# Patient Record
Sex: Female | Born: 1960 | Race: White | Hispanic: No | Marital: Married
Health system: Southern US, Community
[De-identification: ages and names within clinical notes are randomized; demographics above are authoritative.]

## PROBLEM LIST (undated history)

## (undated) DIAGNOSIS — J95811 Postprocedural pneumothorax: Secondary | ICD-10-CM

## (undated) DIAGNOSIS — J9621 Acute and chronic respiratory failure with hypoxia: Secondary | ICD-10-CM

## (undated) DIAGNOSIS — Z9689 Presence of other specified functional implants: Secondary | ICD-10-CM

## (undated) DIAGNOSIS — J181 Lobar pneumonia, unspecified organism: Secondary | ICD-10-CM

## (undated) DIAGNOSIS — J869 Pyothorax without fistula: Secondary | ICD-10-CM

## (undated) HISTORY — PX: LUNG DECORTICATION: SHX454

## (undated) HISTORY — PX: CHEST TUBE INSERTION: SHX231

## (undated) HISTORY — PX: INTUBATION-ENDOTRACHEAL WITH TRACHEOSTOMY STANDBY: SHX6592

---

## 2018-06-22 ENCOUNTER — Inpatient Hospital Stay
Admit: 2018-06-22 | Discharge: 2018-07-19 | Disposition: A | Discharge status: Home / Self Care | Payer: Medicare Other | Source: Other Acute Inpatient Hospital | Attending: Internal Medicine | Admitting: Internal Medicine

## 2018-06-22 DIAGNOSIS — J9621 Acute and chronic respiratory failure with hypoxia: Secondary | ICD-10-CM | POA: Diagnosis present

## 2018-06-22 DIAGNOSIS — J90 Pleural effusion, not elsewhere classified: Secondary | ICD-10-CM

## 2018-06-22 DIAGNOSIS — J189 Pneumonia, unspecified organism: Secondary | ICD-10-CM

## 2018-06-22 DIAGNOSIS — J869 Pyothorax without fistula: Secondary | ICD-10-CM | POA: Diagnosis present

## 2018-06-22 DIAGNOSIS — J181 Lobar pneumonia, unspecified organism: Secondary | ICD-10-CM

## 2018-06-22 DIAGNOSIS — J95811 Postprocedural pneumothorax: Secondary | ICD-10-CM | POA: Diagnosis present

## 2018-06-22 DIAGNOSIS — Z9689 Presence of other specified functional implants: Secondary | ICD-10-CM

## 2018-06-22 DIAGNOSIS — Z9889 Other specified postprocedural states: Secondary | ICD-10-CM

## 2018-06-22 DIAGNOSIS — J939 Pneumothorax, unspecified: Secondary | ICD-10-CM

## 2018-06-22 HISTORY — DX: Pyothorax without fistula: J86.9

## 2018-06-22 HISTORY — DX: Lobar pneumonia, unspecified organism: J18.1

## 2018-06-22 HISTORY — DX: Postprocedural pneumothorax: J95.811

## 2018-06-22 HISTORY — DX: Acute and chronic respiratory failure with hypoxia: J96.21

## 2018-06-22 HISTORY — DX: Presence of other specified functional implants: Z96.89

## 2018-06-23 ENCOUNTER — Institutional Professional Consult (permissible substitution) (HOSPITAL_COMMUNITY): Payer: Medicare Other

## 2018-06-23 ENCOUNTER — Encounter: Payer: Self-pay | Admitting: Internal Medicine

## 2018-06-23 DIAGNOSIS — J15211 Pneumonia due to Methicillin susceptible Staphylococcus aureus: Secondary | ICD-10-CM

## 2018-06-23 DIAGNOSIS — Z9689 Presence of other specified functional implants: Secondary | ICD-10-CM

## 2018-06-23 DIAGNOSIS — J869 Pyothorax without fistula: Secondary | ICD-10-CM | POA: Diagnosis not present

## 2018-06-23 DIAGNOSIS — J9621 Acute and chronic respiratory failure with hypoxia: Secondary | ICD-10-CM | POA: Diagnosis not present

## 2018-06-23 DIAGNOSIS — J95811 Postprocedural pneumothorax: Secondary | ICD-10-CM | POA: Diagnosis not present

## 2018-06-23 DIAGNOSIS — J181 Lobar pneumonia, unspecified organism: Secondary | ICD-10-CM

## 2018-06-23 DIAGNOSIS — J189 Pneumonia, unspecified organism: Secondary | ICD-10-CM

## 2018-06-23 HISTORY — DX: Pneumonia, unspecified organism: J18.9

## 2018-06-23 LAB — COMPREHENSIVE METABOLIC PANEL
ALT: 6 U/L (ref 0–44)
AST: 16 U/L (ref 15–41)
Albumin: 1.9 g/dL — ABNORMAL LOW (ref 3.5–5.0)
Alkaline Phosphatase: 80 U/L (ref 38–126)
Anion gap: 15 (ref 5–15)
BUN: <5 mg/dL — ABNORMAL LOW (ref 6–20)
CHLORIDE: 103 mmol/L (ref 98–111)
CO2: 24 mmol/L (ref 22–32)
Calcium: 8.2 mg/dL — ABNORMAL LOW (ref 8.9–10.3)
Creatinine, Ser: 0.74 mg/dL (ref 0.44–1.00)
GFR calc Af Amer: >60 mL/min (ref >60)
Glucose, Bld: 144 mg/dL — ABNORMAL HIGH (ref 70–99)
Potassium: 3 mmol/L — ABNORMAL LOW (ref 3.5–5.1)
Sodium: 142 mmol/L (ref 135–145)
Total Bilirubin: 0.5 mg/dL (ref 0.3–1.2)
Total Protein: 6 g/dL — ABNORMAL LOW (ref 6.5–8.1)

## 2018-06-23 LAB — CBC WITH DIFFERENTIAL/PLATELET
Basophils Absolute: 0.1 10*3/uL (ref 0.0–0.1)
Basophils Relative: 1 %
Eosinophils Absolute: 0.3 10*3/uL (ref 0.0–0.7)
Eosinophils Relative: 2 %
HEMATOCRIT: 27.2 % — AB (ref 36.0–46.0)
Hemoglobin: 8.1 g/dL — ABNORMAL LOW (ref 12.0–15.0)
LYMPHS ABS: 3 10*3/uL (ref 0.7–4.0)
Lymphocytes Relative: 23 %
MCH: 28.5 pg (ref 26.0–34.0)
MCHC: 29.8 g/dL — AB (ref 30.0–36.0)
MCV: 95.8 fL (ref 78.0–100.0)
Monocytes Absolute: 0.9 10*3/uL (ref 0.1–1.0)
Monocytes Relative: 7 %
NEUTROS ABS: 8.7 10*3/uL — AB (ref 1.7–7.7)
Neutrophils Relative %: 67 %
PLATELETS: 534 10*3/uL — AB (ref 150–400)
RBC: 2.84 MIL/uL — ABNORMAL LOW (ref 3.87–5.11)
RDW: 16 % — AB (ref 11.5–15.5)
WBC: 13 10*3/uL — ABNORMAL HIGH (ref 4.0–10.5)

## 2018-06-23 LAB — PROTIME-INR
INR: 1.38
Prothrombin Time: 16.8 seconds — ABNORMAL HIGH (ref 11.4–15.2)

## 2018-06-23 LAB — POTASSIUM: POTASSIUM: 3.2 mmol/L — AB (ref 3.5–5.1)

## 2018-06-23 MED ORDER — INSULIN LISPRO 100 UNIT/ML ~~LOC~~ SOLN
2.00 | SUBCUTANEOUS | Status: DC
Start: 2018-06-22 — End: 2018-06-23

## 2018-06-23 MED ORDER — GENERIC EXTERNAL MEDICATION
650.00 | Status: DC
Start: ? — End: 2018-06-23

## 2018-06-23 MED ORDER — FAMOTIDINE 20 MG PO TABS
20.00 | ORAL_TABLET | ORAL | Status: DC
Start: 2018-06-22 — End: 2018-06-23

## 2018-06-23 MED ORDER — LABETALOL HCL 5 MG/ML IV SOLN
10.00 | INTRAVENOUS | Status: DC
Start: ? — End: 2018-06-23

## 2018-06-23 MED ORDER — BENZOCAINE-MENTHOL 15-3.6 MG MT LOZG
1.00 | LOZENGE | OROMUCOSAL | Status: DC
Start: ? — End: 2018-06-23

## 2018-06-23 MED ORDER — ALPRAZOLAM 0.5 MG PO TABS
0.50 | ORAL_TABLET | ORAL | Status: DC
Start: ? — End: 2018-06-23

## 2018-06-23 MED ORDER — INSULIN GLARGINE 100 UNIT/ML ~~LOC~~ SOLN
10.00 | SUBCUTANEOUS | Status: DC
Start: 2018-06-22 — End: 2018-06-23

## 2018-06-23 MED ORDER — GENERIC EXTERNAL MEDICATION
4.00 | Status: DC
Start: ? — End: 2018-06-23

## 2018-06-23 MED ORDER — LORAZEPAM 1 MG PO TABS
1.00 | ORAL_TABLET | ORAL | Status: DC
Start: 2018-06-22 — End: 2018-06-23

## 2018-06-23 MED ORDER — SODIUM CHLORIDE 0.9 % IJ SOLN
10.00 | INTRAMUSCULAR | Status: DC
Start: 2018-06-22 — End: 2018-06-23

## 2018-06-23 MED ORDER — SODIUM CHLORIDE 0.9 % IV SOLN
10.00 | INTRAVENOUS | Status: DC
Start: ? — End: 2018-06-23

## 2018-06-23 MED ORDER — MORPHINE SULFATE (PF) 2 MG/ML IV SOLN
2.00 | INTRAVENOUS | Status: DC
Start: ? — End: 2018-06-23

## 2018-06-23 MED ORDER — HYDROCODONE-ACETAMINOPHEN 5-325 MG PO TABS
1.00 | ORAL_TABLET | ORAL | Status: DC
Start: ? — End: 2018-06-23

## 2018-06-23 MED ORDER — ACETAMINOPHEN 325 MG PO TABS
650.00 | ORAL_TABLET | ORAL | Status: DC
Start: ? — End: 2018-06-23

## 2018-06-23 MED ORDER — AMLODIPINE BESYLATE 5 MG PO TABS
5.00 | ORAL_TABLET | ORAL | Status: DC
Start: 2018-06-23 — End: 2018-06-23

## 2018-06-23 MED ORDER — THERA PO TABS
1.00 | ORAL_TABLET | ORAL | Status: DC
Start: 2018-06-23 — End: 2018-06-23

## 2018-06-23 MED ORDER — FLUOXETINE HCL 20 MG PO CAPS
40.00 | ORAL_CAPSULE | ORAL | Status: DC
Start: 2018-06-23 — End: 2018-06-23

## 2018-06-23 MED ORDER — OXYCODONE HCL 10 MG PO TABS
10.00 | ORAL_TABLET | ORAL | Status: DC
Start: ? — End: 2018-06-23

## 2018-06-23 MED ORDER — ONDANSETRON HCL 4 MG/2ML IJ SOLN
4.00 | INTRAMUSCULAR | Status: DC
Start: ? — End: 2018-06-23

## 2018-06-23 MED ORDER — POTASSIUM CHLORIDE CRYS ER 20 MEQ PO TBCR
40.00 | EXTENDED_RELEASE_TABLET | ORAL | Status: DC
Start: 2018-06-23 — End: 2018-06-23

## 2018-06-23 MED ORDER — METOCLOPRAMIDE HCL 5 MG/ML IJ SOLN
10.00 | INTRAMUSCULAR | Status: DC
Start: ? — End: 2018-06-23

## 2018-06-23 MED ORDER — IPRATROPIUM-ALBUTEROL 0.5-2.5 (3) MG/3ML IN SOLN
3.00 | RESPIRATORY_TRACT | Status: DC
Start: ? — End: 2018-06-23

## 2018-06-23 MED ORDER — ENOXAPARIN SODIUM 40 MG/0.4ML ~~LOC~~ SOLN
40.00 | SUBCUTANEOUS | Status: DC
Start: 2018-06-22 — End: 2018-06-23

## 2018-06-23 MED ORDER — OXYCODONE HCL 5 MG PO TABS
5.00 | ORAL_TABLET | ORAL | Status: DC
Start: ? — End: 2018-06-23

## 2018-06-23 MED ORDER — CLINDAMYCIN HCL 150 MG PO CAPS
450.00 | ORAL_CAPSULE | ORAL | Status: DC
Start: 2018-06-22 — End: 2018-06-23

## 2018-06-23 MED ORDER — MUPIROCIN 2 % EX OINT
.25 | TOPICAL_OINTMENT | CUTANEOUS | Status: DC
Start: 2018-06-22 — End: 2018-06-23

## 2018-06-23 MED ORDER — HYDROMORPHONE HCL 1 MG/ML IJ SOLN
0.50 | INTRAMUSCULAR | Status: DC
Start: ? — End: 2018-06-23

## 2018-06-23 MED ORDER — ALUM & MAG HYDROXIDE-SIMETH 200-200-20 MG/5ML PO SUSP
30.00 | ORAL | Status: DC
Start: ? — End: 2018-06-23

## 2018-06-23 MED ORDER — CEFAZOLIN SODIUM-DEXTROSE 2-3 GM-%(50ML) IV SOLR
2.00 | INTRAVENOUS | Status: DC
Start: 2018-06-22 — End: 2018-06-23

## 2018-06-23 NOTE — Consult Note (Addendum)
Pulmonary Critical Care Medicine [facility] [place]  PULMONARY SERVICE  Date of Service: 06/23/2018  PULMONARY CRITICAL CARE CONSULT   Kristina Bush  HYQ:[id number]  DOB: 1961/04/30   DOA: 06/22/2018  Referring Physician: Carron Curie, MD  HPI: Kristina Bush is a 57 y.o. female seen for follow up of Acute on Chronic Respiratory Failure.  Patient was apparently admitted to the transferring facility with a diagnosis of empyema of the right side.  Patient required a decortication which was done on 6 September.  Patient had also developed a right lower lobe pneumonia and pneumothorax on the right side.  Patient was found to have sepsis with staphylococcal bacteremia.  She was treated with IV antibiotics and had negative blood cultures as of 27 August.  Hospital course was as follows she was admitted with septic shock acute renal failure lactic acidosis and a pneumothorax.  Patient was started on resuscitation with fluid also placed on pressors.  Patient was given broad-spectrum antibiotics.  She also was intubated at the time and had a right-sided CVL placed which resulted in a pneumothorax.  Uptake apparently was 5 and she did grow staph 2 out of 2.  She was transferred to [facility] and at the time of transfer she was intubated on Levophed and she was eventually weaned and extubated.  Her course was complicated by development of an empyema which required surgical decortication.  She now presents to our facility with the chest tube in place and she still has significant leak there is concern obviously for bronchopleural fistula however according to the discharge summary she is supposed to eventually be placed on waterseal and then hopefully have the tube removed.  Review of Systems:  ROS performed and is unremarkable other than noted above.  Past Medical History:  Diagnosis Date  . Acute on chronic respiratory failure with hypoxia (HCC)   . Chest tube in place   . Empyema lung (HCC)    . Iatrogenic pneumothorax     Past Surgical History:  Procedure Laterality Date  . CHEST TUBE INSERTION    . INTUBATION-ENDOTRACHEAL WITH TRACHEOSTOMY STANDBY    . LUNG DECORTICATION      Social History:    has an unknown smoking status. She has never used smokeless tobacco. She reports that she drank alcohol. She reports that she has current or past drug history.  Family History: Non-Contributory to the present illness  Allergies not on file  Medications: Reviewed on Rounds  Physical Exam:  Vitals: Temperature 97.6 pulse 98 respiratory rate 20 blood pressure 148/90 saturations 100%  Ventilator Settings patient is off of the ventilator  . General: Comfortable at this time . Eyes: Grossly normal lids, irises & conjunctiva . ENT: grossly tongue is normal . Neck: no obvious mass . Cardiovascular: S1-S2 normal no gallop or rub . Respiratory: Coarse breath sounds no rhonchi no rales are noted. . Abdomen: Soft nontender . Skin: no rash seen on limited exam . Musculoskeletal: not rigid . Psychiatric:unable to assess . Neurologic: no seizure no involuntary movements         Labs on Admission:  Basic Metabolic Panel: Recent Labs  Lab 06/23/18 0541  NA 142  K 3.0*  CL 103  CO2 24  GLUCOSE 144*  BUN <5*  CREATININE 0.74  CALCIUM 8.2*    No results for input(s): PHART, PCO2ART, PO2ART, HCO3, O2SAT in the last 168 hours.  Liver Function Tests: Recent Labs  Lab 06/23/18 0541  AST 16  ALT 6  ALKPHOS 80  BILITOT 0.5  PROT 6.0*  ALBUMIN 1.9*   No results for input(s): LIPASE, AMYLASE in the last 168 hours. No results for input(s): AMMONIA in the last 168 hours.  CBC: Recent Labs  Lab 06/23/18 0541  WBC 13.0*  NEUTROABS 8.7*  HGB 8.1*  HCT 27.2*  MCV 95.8  PLT 534*    Cardiac Enzymes: No results for input(s): CKTOTAL, CKMB, CKMBINDEX, TROPONINI in the last 168 hours.  BNP (last 3 results) No results for input(s): BNP in the last 8760  hours.  ProBNP (last 3 results) No results for input(s): PROBNP in the last 8760 hours.   Radiological Exams on Admission: Dg Chest Port 1 View  Result Date: 06/23/2018 CLINICAL DATA:  Chest tube placement EXAM: PORTABLE CHEST 1 VIEW COMPARISON:  None. FINDINGS: There are 2 chest tubes on the right without appreciable pneumothorax. There is subcutaneous air on the right. There is a minimal right pleural effusion. There is no edema or consolidation. Heart size and pulmonary vascularity are normal. No adenopathy. IMPRESSION: Chest tubes present on the right. There is subcutaneous air on the right, but there is no evident pneumothorax. No edema or consolidation. Heart size normal. Electronically Signed   By: Bretta Bang III M.D.   On: 06/23/2018 07:36    Assessment/Plan Active Problems:   Acute on chronic respiratory failure with hypoxia (HCC)   Empyema lung (HCC)   Iatrogenic pneumothorax   Chest tube in place   1. Acute on chronic respiratory failure with hypoxia patient status post intubation and extubation this is pretty much resolved she remains on supportive care currently she is actually not requiring any oxygen. 2. Empyema she had decortication as she had a complicated empyema at the other facility.  We will continue to monitor with x-rays. 3. Iatrogenic pneumothorax chest tube in place 4. Chest tube in place we are going to work hopefully towards removal of the chest tube right now she does still have a leak will eventually we will place her on waterseal and if her leak stops then we can probably safely discontinue the chest tube. 5. Right lower lobe pneumonia she has been treated with antibiotics we will need to continue to monitor her closely right now she is clinically improving the last chest x-ray shows no pneumothorax no consolidation we will continue to monitor  I have personally seen and evaluated the patient, evaluated laboratory and imaging results, formulated the  assessment and plan and placed orders. The Patient requires high complexity decision making for assessment and support.  Case was discussed on Rounds with the Respiratory Therapy Staff Time Spent  Yevonne Pax, MD [facility] Pulmonary Critical Care Medicine Sleep Medicine

## 2018-06-24 DIAGNOSIS — J181 Lobar pneumonia, unspecified organism: Secondary | ICD-10-CM

## 2018-06-24 DIAGNOSIS — J9621 Acute and chronic respiratory failure with hypoxia: Secondary | ICD-10-CM | POA: Diagnosis not present

## 2018-06-24 DIAGNOSIS — J90 Pleural effusion, not elsewhere classified: Secondary | ICD-10-CM

## 2018-06-24 DIAGNOSIS — Z9689 Presence of other specified functional implants: Secondary | ICD-10-CM | POA: Diagnosis not present

## 2018-06-24 DIAGNOSIS — J95811 Postprocedural pneumothorax: Secondary | ICD-10-CM | POA: Diagnosis not present

## 2018-06-24 DIAGNOSIS — J869 Pyothorax without fistula: Secondary | ICD-10-CM | POA: Diagnosis not present

## 2018-06-24 LAB — C DIFFICILE QUICK SCREEN W PCR REFLEX
C DIFFICILE (CDIFF) INTERP: NOT DETECTED
C DIFFICILE (CDIFF) TOXIN: NEGATIVE
C Diff antigen: NEGATIVE

## 2018-06-24 LAB — POTASSIUM: Potassium: 3 mmol/L — ABNORMAL LOW (ref 3.5–5.1)

## 2018-06-24 NOTE — Progress Notes (Signed)
Pulmonary Critical Care Medicine [facility] [place]   PULMONARY CRITICAL CARE SERVICE  PROGRESS NOTE  Date of Service: 06/24/2018  Kristina Bush  ZOX:[id number]  DOB: 06-Apr-1961   DOA: 06/22/2018  Referring Physician: Carron Curie, MD  HPI: Kristina Bush is a 57 y.o. female seen for follow up of Acute on Chronic Respiratory Failure.  Patient is comfortable without distress at this time.  Has been off the ventilator.  Chest tube still has a leak in it.  She is not able to be put to waterseal at this time  Medications: Reviewed on Rounds  Physical Exam:  Vitals: Temperature 98.0 pulse 110 respiratory rate 16 blood pressure 147/88 saturations 98%  Ventilator Settings off the ventilator at this time on room air  . General: Comfortable at this time . Eyes: Grossly normal lids, irises & conjunctiva . ENT: grossly tongue is normal . Neck: no obvious mass . Cardiovascular: S1 S2 normal no gallop . Respiratory: No rhonchi no rales . Abdomen: soft . Skin: no rash seen on limited exam . Musculoskeletal: not rigid . Psychiatric:unable to assess . Neurologic: no seizure no involuntary movements         Lab Data:   Basic Metabolic Panel: Recent Labs  Lab 06/23/18 0541 06/23/18 2148 06/24/18 0728  NA 142  --   --   K 3.0* 3.2* 3.0*  CL 103  --   --   CO2 24  --   --   GLUCOSE 144*  --   --   BUN <5*  --   --   CREATININE 0.74  --   --   CALCIUM 8.2*  --   --     ABG: No results for input(s): PHART, PCO2ART, PO2ART, HCO3, O2SAT in the last 168 hours.  Liver Function Tests: Recent Labs  Lab 06/23/18 0541  AST 16  ALT 6  ALKPHOS 80  BILITOT 0.5  PROT 6.0*  ALBUMIN 1.9*   No results for input(s): LIPASE, AMYLASE in the last 168 hours. No results for input(s): AMMONIA in the last 168 hours.  CBC: Recent Labs  Lab 06/23/18 0541  WBC 13.0*  NEUTROABS 8.7*  HGB 8.1*  HCT 27.2*  MCV 95.8  PLT 534*    Cardiac Enzymes: No results for  input(s): CKTOTAL, CKMB, CKMBINDEX, TROPONINI in the last 168 hours.  BNP (last 3 results) No results for input(s): BNP in the last 8760 hours.  ProBNP (last 3 results) No results for input(s): PROBNP in the last 8760 hours.  Radiological Exams: Dg Chest Port 1 View  Result Date: 06/23/2018 CLINICAL DATA:  Chest tube placement EXAM: PORTABLE CHEST 1 VIEW COMPARISON:  None. FINDINGS: There are 2 chest tubes on the right without appreciable pneumothorax. There is subcutaneous air on the right. There is a minimal right pleural effusion. There is no edema or consolidation. Heart size and pulmonary vascularity are normal. No adenopathy. IMPRESSION: Chest tubes present on the right. There is subcutaneous air on the right, but there is no evident pneumothorax. No edema or consolidation. Heart size normal. Electronically Signed   By: Bretta Bang III M.D.   On: 06/23/2018 07:36    Assessment/Plan Active Problems:   Acute on chronic respiratory failure with hypoxia (HCC)   Empyema lung (HCC)   Iatrogenic pneumothorax   Chest tube in place   Right lower lobe pneumonia (HCC)   1. Acute on chronic respiratory failure with hypoxia we will continue with the oxygen if needed.  Patient right now is off of the ventilator.  And off of the oxygen. 2. Empyema of the lung status post chest tube we will continue with supportive care 3. Iatrogenic pneumothorax still with a leak continue present management 4. Chest tube we will continue to suction 5. Right lower lobe pneumonia at baseline now since treated last chest x-ray revealed no pneumothorax and no consolidation   I have personally seen and evaluated the patient, evaluated laboratory and imaging results, formulated the assessment and plan and placed orders. The Patient requires high complexity decision making for assessment and support.  Case was discussed on Rounds with the Respiratory Therapy Staff  Yevonne PaxSaadat A Khan, MD [facility] Pulmonary Critical  Care Medicine Sleep Medicine

## 2018-06-25 ENCOUNTER — Other Ambulatory Visit (HOSPITAL_COMMUNITY): Payer: Medicare Other

## 2018-06-25 LAB — POTASSIUM: POTASSIUM: 4 mmol/L (ref 3.5–5.1)

## 2018-06-27 ENCOUNTER — Other Ambulatory Visit (HOSPITAL_COMMUNITY): Payer: Medicare Other

## 2018-06-27 LAB — BASIC METABOLIC PANEL
Anion gap: 11 (ref 5–15)
BUN: 7 mg/dL (ref 6–20)
CHLORIDE: 100 mmol/L (ref 98–111)
CO2: 26 mmol/L (ref 22–32)
CREATININE: 0.63 mg/dL (ref 0.44–1.00)
Calcium: 8.4 mg/dL — ABNORMAL LOW (ref 8.9–10.3)
GFR calc Af Amer: >60 mL/min (ref >60)
GFR calc non Af Amer: >60 mL/min (ref >60)
Glucose, Bld: 151 mg/dL — ABNORMAL HIGH (ref 70–99)
Potassium: 2.8 mmol/L — ABNORMAL LOW (ref 3.5–5.1)
Sodium: 137 mmol/L (ref 135–145)

## 2018-06-27 LAB — CBC
HEMATOCRIT: 29.7 % — AB (ref 36.0–46.0)
HEMOGLOBIN: 9 g/dL — AB (ref 12.0–15.0)
MCH: 28.6 pg (ref 26.0–34.0)
MCHC: 30.3 g/dL (ref 30.0–36.0)
MCV: 94.3 fL (ref 78.0–100.0)
Platelets: 564 10*3/uL — ABNORMAL HIGH (ref 150–400)
RBC: 3.15 MIL/uL — ABNORMAL LOW (ref 3.87–5.11)
RDW: 16.1 % — AB (ref 11.5–15.5)
WBC: 15.8 10*3/uL — ABNORMAL HIGH (ref 4.0–10.5)

## 2018-06-27 LAB — MAGNESIUM: Magnesium: 1.8 mg/dL (ref 1.7–2.4)

## 2018-06-28 ENCOUNTER — Other Ambulatory Visit (HOSPITAL_COMMUNITY): Payer: Medicare Other

## 2018-06-28 DIAGNOSIS — J869 Pyothorax without fistula: Secondary | ICD-10-CM | POA: Diagnosis not present

## 2018-06-28 DIAGNOSIS — J9621 Acute and chronic respiratory failure with hypoxia: Secondary | ICD-10-CM | POA: Diagnosis not present

## 2018-06-28 DIAGNOSIS — Z9689 Presence of other specified functional implants: Secondary | ICD-10-CM | POA: Diagnosis not present

## 2018-06-28 DIAGNOSIS — J95811 Postprocedural pneumothorax: Secondary | ICD-10-CM | POA: Diagnosis not present

## 2018-06-28 LAB — BASIC METABOLIC PANEL
ANION GAP: 12 (ref 5–15)
BUN: 8 mg/dL (ref 6–20)
CO2: 25 mmol/L (ref 22–32)
Calcium: 8.6 mg/dL — ABNORMAL LOW (ref 8.9–10.3)
Chloride: 100 mmol/L (ref 98–111)
Creatinine, Ser: 0.66 mg/dL (ref 0.44–1.00)
Glucose, Bld: 168 mg/dL — ABNORMAL HIGH (ref 70–99)
POTASSIUM: 4 mmol/L (ref 3.5–5.1)
Sodium: 137 mmol/L (ref 135–145)

## 2018-06-28 LAB — MAGNESIUM: MAGNESIUM: 1.9 mg/dL (ref 1.7–2.4)

## 2018-06-28 NOTE — Progress Notes (Signed)
Pulmonary Critical Care Medicine [facility] [place]   PULMONARY CRITICAL CARE SERVICE  PROGRESS NOTE  Date of Service: 06/28/2018  Kristina Bush  ZOX:[id number]  DOB: 01/31/1961   DOA: 06/22/2018  Referring Physician: Carron Curie, MD  HPI: Kristina Bush is a 57 y.o. female seen for follow up of Acute on Chronic Respiratory Failure.  Patient remains off oxygen.  Still has significant break in the chest tube  Medications: Reviewed on Rounds  Physical Exam:  Vitals: Temperature 97.7 pulse 92 respiratory 20 blood pressure 132/75 saturation 97%  Ventilator Settings off the ventilator at this time  . General: Comfortable at this time . Eyes: Grossly normal lids, irises & conjunctiva . ENT: grossly tongue is normal . Neck: no obvious mass . Cardiovascular: S1 S2 normal no gallop . Respiratory: No rhonchi or rales are noted . Abdomen: soft . Skin: no rash seen on limited exam . Musculoskeletal: not rigid . Psychiatric:unable to assess . Neurologic: no seizure no involuntary movements         Lab Data:   Basic Metabolic Panel: Recent Labs  Lab 06/23/18 0541 06/23/18 2148 06/24/18 0728 06/25/18 0555 06/27/18 0654 06/28/18 1000  NA 142  --   --   --  137 137  K 3.0* 3.2* 3.0* 4.0 2.8* 4.0  CL 103  --   --   --  100 100  CO2 24  --   --   --  26 25  GLUCOSE 144*  --   --   --  151* 168*  BUN <5*  --   --   --  7 8  CREATININE 0.74  --   --   --  0.63 0.66  CALCIUM 8.2*  --   --   --  8.4* 8.6*  MG  --   --   --   --  1.8 1.9    ABG: No results for input(s): PHART, PCO2ART, PO2ART, HCO3, O2SAT in the last 168 hours.  Liver Function Tests: Recent Labs  Lab 06/23/18 0541  AST 16  ALT 6  ALKPHOS 80  BILITOT 0.5  PROT 6.0*  ALBUMIN 1.9*   No results for input(s): LIPASE, AMYLASE in the last 168 hours. No results for input(s): AMMONIA in the last 168 hours.  CBC: Recent Labs  Lab 06/23/18 0541 06/27/18 0654  WBC 13.0* 15.8*   NEUTROABS 8.7*  --   HGB 8.1* 9.0*  HCT 27.2* 29.7*  MCV 95.8 94.3  PLT 534* 564*    Cardiac Enzymes: No results for input(s): CKTOTAL, CKMB, CKMBINDEX, TROPONINI in the last 168 hours.  BNP (last 3 results) No results for input(s): BNP in the last 8760 hours.  ProBNP (last 3 results) No results for input(s): PROBNP in the last 8760 hours.  Radiological Exams: Dg Chest Port 1 View  Result Date: 06/28/2018 CLINICAL DATA:  57 year old female with pleural effusion and chest tubes. Subsequent encounter. EXAM: PORTABLE CHEST 1 VIEW COMPARISON:  06/27/2018 chest x-ray. FINDINGS: Two right-sided chest tubes are in place. 1 of the chest tubes side hole is at the level of the border of the thorax and cannot be confirmed as completely within the right thorax. Subcutaneous emphysema. Pleural thickening. No obvious pneumothorax. Right base subsegmental atelectasis and elevated right hemidiaphragm. Cardiomegaly.  Calcified tortuous aorta.  Possible hiatal hernia. IMPRESSION: 1. Two right-sided chest tubes are in place. 1 of the chest tubes side hole is at the level of the border of the thorax and  cannot be confirmed as completely within the right thorax (slight patient rotation to the right limits evaluation). Subcutaneous emphysema. 2. Pleural thickening.  No obvious pneumothorax. 3. Right base subsegmental atelectasis and elevated right hemidiaphragm. 4.  Aortic Atherosclerosis (ICD10-I70.0). 5. These results will be called to the ordering clinician or representative by the Radiologist Assistant, and communication documented in the PACS or zVision Dashboard. Electronically Signed   By: Lacy DuverneySteven  Olson M.D.   On: 06/28/2018 07:28   Dg Chest Port 1 View  Result Date: 06/27/2018 CLINICAL DATA:  Pleural effusion EXAM: PORTABLE CHEST 1 VIEW COMPARISON:  06/25/2018 FINDINGS: Postsurgical changes in the right hemithorax with stable loculated right pleural effusion/lateral pleural thickening. Indwelling right  chest tubes.  No pneumothorax is seen. Left lung is clear. The heart is normal in size IMPRESSION: Stable loculated right pleural effusion/lateral pleural thickening. Indwelling right chest tubes.  No pneumothorax is seen. Electronically Signed   By: Charline BillsSriyesh  Krishnan M.D.   On: 06/27/2018 08:05    Assessment/Plan Active Problems:   Acute on chronic respiratory failure with hypoxia (HCC)   Empyema lung (HCC)   Iatrogenic pneumothorax   Chest tube in place   Right lower lobe pneumonia (HCC)   1. Acute on chronic respiratory failure with hypoxia we will continue with the supportive care no oxygen required right now. 2. Iatrogenic pneumothorax still with significant leak 3. Empyema drained we will continue to monitor 4. Chest tube need to work towards hopefully placing on waterseal right now still has a leak 5. Right lower lobe pneumonia treated last chest x-ray had shown a stable right pleural effusion which was loculated   I have personally seen and evaluated the patient, evaluated laboratory and imaging results, formulated the assessment and plan and placed orders. The Patient requires high complexity decision making for assessment and support.  Case was discussed on Rounds with the Respiratory Therapy Staff  Yevonne PaxSaadat A Khan, MD [facility] Pulmonary Critical Care Medicine Sleep Medicine

## 2018-06-29 LAB — CULTURE, BLOOD (ROUTINE X 2)
CULTURE: NO GROWTH
Culture: NO GROWTH
Special Requests: ADEQUATE

## 2018-06-30 ENCOUNTER — Other Ambulatory Visit (HOSPITAL_COMMUNITY): Payer: Medicare Other

## 2018-06-30 DIAGNOSIS — J95811 Postprocedural pneumothorax: Secondary | ICD-10-CM | POA: Diagnosis not present

## 2018-06-30 DIAGNOSIS — Z9689 Presence of other specified functional implants: Secondary | ICD-10-CM | POA: Diagnosis not present

## 2018-06-30 DIAGNOSIS — J9621 Acute and chronic respiratory failure with hypoxia: Secondary | ICD-10-CM | POA: Diagnosis not present

## 2018-06-30 DIAGNOSIS — J869 Pyothorax without fistula: Secondary | ICD-10-CM | POA: Diagnosis not present

## 2018-06-30 LAB — CBC
HEMATOCRIT: 29.5 % — AB (ref 36.0–46.0)
Hemoglobin: 8.7 g/dL — ABNORMAL LOW (ref 12.0–15.0)
MCH: 27.8 pg (ref 26.0–34.0)
MCHC: 29.5 g/dL — ABNORMAL LOW (ref 30.0–36.0)
MCV: 94.2 fL (ref 78.0–100.0)
PLATELETS: 560 10*3/uL — AB (ref 150–400)
RBC: 3.13 MIL/uL — AB (ref 3.87–5.11)
RDW: 16 % — ABNORMAL HIGH (ref 11.5–15.5)
WBC: 12.6 10*3/uL — AB (ref 4.0–10.5)

## 2018-06-30 LAB — BASIC METABOLIC PANEL
ANION GAP: 14 (ref 5–15)
BUN: 8 mg/dL (ref 6–20)
CALCIUM: 8.6 mg/dL — AB (ref 8.9–10.3)
CO2: 24 mmol/L (ref 22–32)
CREATININE: 0.69 mg/dL (ref 0.44–1.00)
Chloride: 102 mmol/L (ref 98–111)
GFR calc non Af Amer: >60 mL/min (ref >60)
GLUCOSE: 218 mg/dL — AB (ref 70–99)
POTASSIUM: 3.2 mmol/L — AB (ref 3.5–5.1)
Sodium: 140 mmol/L (ref 135–145)

## 2018-06-30 NOTE — Progress Notes (Signed)
[facility]ulmonary [facility] [place]   PULMONARY CRITICAL CARE SERVICE  PROGRESS NOTE  Date of Service: 06/30/2018  Kristina FitchJudy Ann Bush  ZOX:[id number]  DOB: 09-Feb-1961   DOA: 06/22/2018  Referring Physician: Carron CurieAli Hijazi, MD  HPI: Kristina Bush is a 57 y.o. female seen for follow up of Acute on Chronic Respiratory Failure.  She is still having a leak in her water chamber.  Patient is on room air.  I suspect that this is going to possibly require surgical input.  Medications: Reviewed on Rounds  Physical Exam:  Vitals: Temperature 98.2 pulse 90 respiratory rate 20 blood pressure is 1 4468 saturations 100%  Ventilator Settings off the ventilator on room air  . General: Comfortable at this time . Eyes: Grossly normal lids, irises & conjunctiva . ENT: grossly tongue is normal . Neck: no obvious mass . Cardiovascular: S1 S2 normal no gallop . Respiratory: Coarse breath sounds no rhonchi . Abdomen: soft . Skin: no rash seen on limited exam . Musculoskeletal: not rigid . Psychiatric:unable to assess . Neurologic: no seizure no involuntary movements         Lab Data:   Basic Metabolic Panel: Recent Labs  Lab 06/24/18 0728 06/25/18 0555 06/27/18 0654 06/28/18 1000 06/30/18 0637  NA  --   --  137 137 140  K 3.0* 4.0 2.8* 4.0 3.2*  CL  --   --  100 100 102  CO2  --   --  26 25 24   GLUCOSE  --   --  151* 168* 218*  BUN  --   --  7 8 8   CREATININE  --   --  0.63 0.66 0.69  CALCIUM  --   --  8.4* 8.6* 8.6*  MG  --   --  1.8 1.9  --     ABG: No results for input(s): PHART, PCO2ART, PO2ART, HCO3, O2SAT in the last 168 hours.  Liver Function Tests: No results for input(s): AST, ALT, ALKPHOS, BILITOT, PROT, ALBUMIN in the last 168 hours. No results for input(s): LIPASE, AMYLASE in the last 168 hours. No results for input(s): AMMONIA in the last 168 hours.  CBC: Recent Labs  Lab 06/27/18 0654 06/30/18 0637  WBC 15.8* 12.6*  HGB 9.0*  8.7*  HCT 29.7* 29.5*  MCV 94.3 94.2  PLT 564* 560*    Cardiac Enzymes: No results for input(s): CKTOTAL, CKMB, CKMBINDEX, TROPONINI in the last 168 hours.  BNP (last 3 results) No results for input(s): BNP in the last 8760 hours.  ProBNP (last 3 results) No results for input(s): PROBNP in the last 8760 hours.  Radiological Exams: Dg Chest Port 1 View  Result Date: 06/30/2018 CLINICAL DATA:  Chest tube placement. EXAM: PORTABLE CHEST 1 VIEW COMPARISON:  Radiograph of June 28, 2018. FINDINGS: Stable cardiomediastinal silhouette. Left lung is clear. Stable position of 2 right-sided chest tubes. No definite pneumothorax is noted. Mild subcutaneous emphysema is seen over right lateral chest wall which is unchanged compared to prior exam. Probable mild loculated pleural effusion or pleural thickening is seen in right lung base laterally. Mild right basilar atelectasis is noted. Bony thorax is unremarkable. IMPRESSION: Stable position of 2 right-sided chest tubes. No definite pneumothorax. Mild right basilar subsegmental atelectasis is noted. Stable probable mild loculated right pleural effusion or pleural thickening. Electronically Signed   By: Lupita RaiderJames  Green Jr, M.D.   On: 06/30/2018 10:43    Assessment/Plan Active Problems:   Acute on chronic respiratory failure  with hypoxia (HCC)   Empyema lung (HCC)   Iatrogenic pneumothorax   Chest tube in place   Right lower lobe pneumonia (HCC)   1. Acute on chronic respiratory failure with hypoxia we will continue with supportive care not on any oxygen right now 2. Empyema of the lung treated status post chest tube placement 3. Iatrogenic pneumothorax no pneumothorax noted on the last chest film however she still has significant leak 4. Right lower lobe pneumonia treated we will continue with supportive care 5. Chest tube in place continue with present management as noted if the leak does not resolve we might need to have surgery see the  patient and give their opinion   I have personally seen and evaluated the patient, evaluated laboratory and imaging results, formulated the assessment and plan and placed orders. The Patient requires high complexity decision making for assessment and support.  Case was discussed on Rounds with the Respiratory Therapy Staff  Yevonne Pax, MD [facility] Pulmonary Critical Care Medicine Sleep Medicine

## 2018-07-01 LAB — BASIC METABOLIC PANEL
ANION GAP: 11 (ref 5–15)
BUN: 7 mg/dL (ref 6–20)
CO2: 23 mmol/L (ref 22–32)
Calcium: 8.3 mg/dL — ABNORMAL LOW (ref 8.9–10.3)
Chloride: 107 mmol/L (ref 98–111)
Creatinine, Ser: 0.57 mg/dL (ref 0.44–1.00)
Glucose, Bld: 127 mg/dL — ABNORMAL HIGH (ref 70–99)
POTASSIUM: 3.6 mmol/L (ref 3.5–5.1)
Sodium: 141 mmol/L (ref 135–145)

## 2018-07-05 ENCOUNTER — Other Ambulatory Visit (HOSPITAL_COMMUNITY): Payer: Medicare Other

## 2018-07-06 DIAGNOSIS — J95811 Postprocedural pneumothorax: Secondary | ICD-10-CM | POA: Diagnosis not present

## 2018-07-06 DIAGNOSIS — J869 Pyothorax without fistula: Secondary | ICD-10-CM | POA: Diagnosis not present

## 2018-07-06 DIAGNOSIS — Z9689 Presence of other specified functional implants: Secondary | ICD-10-CM | POA: Diagnosis not present

## 2018-07-06 DIAGNOSIS — J9621 Acute and chronic respiratory failure with hypoxia: Secondary | ICD-10-CM | POA: Diagnosis not present

## 2018-07-06 LAB — CBC
HCT: 31.1 % — ABNORMAL LOW (ref 36.0–46.0)
Hemoglobin: 9.4 g/dL — ABNORMAL LOW (ref 12.0–15.0)
MCH: 29.2 pg (ref 26.0–34.0)
MCHC: 30.2 g/dL (ref 30.0–36.0)
MCV: 96.6 fL (ref 78.0–100.0)
PLATELETS: 596 10*3/uL — AB (ref 150–400)
RBC: 3.22 MIL/uL — ABNORMAL LOW (ref 3.87–5.11)
RDW: 16.6 % — AB (ref 11.5–15.5)
WBC: 10.6 10*3/uL — ABNORMAL HIGH (ref 4.0–10.5)

## 2018-07-06 LAB — BASIC METABOLIC PANEL
Anion gap: 11 (ref 5–15)
BUN: 11 mg/dL (ref 6–20)
CO2: 25 mmol/L (ref 22–32)
Calcium: 9.1 mg/dL (ref 8.9–10.3)
Chloride: 106 mmol/L (ref 98–111)
Creatinine, Ser: 0.68 mg/dL (ref 0.44–1.00)
GFR calc Af Amer: >60 mL/min (ref >60)
GLUCOSE: 179 mg/dL — AB (ref 70–99)
POTASSIUM: 3.8 mmol/L (ref 3.5–5.1)
Sodium: 142 mmol/L (ref 135–145)

## 2018-07-06 LAB — MAGNESIUM: Magnesium: 1.7 mg/dL (ref 1.7–2.4)

## 2018-07-06 NOTE — Progress Notes (Signed)
[facility] [place]   PULMONARY CRITICAL CARE SERVICE  PROGRESS NOTE  Date of Service: 07/06/2018  Kristina FitchJudy Ann Bush  ZOX:[id number]  DOB: 03-01-1961   DOA: 06/22/2018  Referring Physician: Carron CurieAli Hijazi, MD  HPI: Kristina Bush is a 57 y.o. female seen for follow up of Acute on Chronic Respiratory Failure.  She is grossly unchanged doing fairly well no distress noted at this time.  Chest x-ray was done which shows a trace pneumothorax along with some fluid.  Patient's chest tube unfortunately still showing leak  Medications: Reviewed on Rounds  Physical Exam:  Vitals: Temperature 98.7 pulse 90 respiratory rate 18 blood pressure 120/60 saturations 99%  Ventilator Settings off the ventilator on room air  . General: Comfortable at this time . Eyes: Grossly normal lids, irises & conjunctiva . ENT: grossly tongue is normal . Neck: no obvious mass . Cardiovascular: S1 S2 normal no gallop . Respiratory: No rhonchi or rales are noted at this time. . Abdomen: soft . Skin: no rash seen on limited exam . Musculoskeletal: not rigid . Psychiatric:unable to assess . Neurologic: no seizure no involuntary movements         Lab Data:   Basic Metabolic Panel: Recent Labs  Lab 06/30/18 0637 07/01/18 0606 07/06/18 0654  NA 140 141 142  K 3.2* 3.6 3.8  CL 102 107 106  CO2 24 23 25   GLUCOSE 218* 127* 179*  BUN 8 7 11   CREATININE 0.69 0.57 0.68  CALCIUM 8.6* 8.3* 9.1  MG  --   --  1.7    ABG: No results for input(s): PHART, PCO2ART, PO2ART, HCO3, O2SAT in the last 168 hours.  Liver Function Tests: No results for input(s): AST, ALT, ALKPHOS, BILITOT, PROT, ALBUMIN in the last 168 hours. No results for input(s): LIPASE, AMYLASE in the last 168 hours. No results for input(s): AMMONIA in the last 168 hours.  CBC: Recent Labs  Lab 06/30/18 0637 07/06/18 0654  WBC 12.6* 10.6*  HGB 8.7* 9.4*  HCT 29.5* 31.1*  MCV 94.2 96.6  PLT  560* 596*    Cardiac Enzymes: No results for input(s): CKTOTAL, CKMB, CKMBINDEX, TROPONINI in the last 168 hours.  BNP (last 3 results) No results for input(s): BNP in the last 8760 hours.  ProBNP (last 3 results) No results for input(s): PROBNP in the last 8760 hours.  Radiological Exams: Dg Chest Port 1 View  Result Date: 07/05/2018 CLINICAL DATA:  Right chest tubes EXAM: PORTABLE CHEST 1 VIEW COMPARISON:  06/30/2018 FINDINGS: Two chest tubes remain on the right, stable in position. Residual right basilar atelectasis/consolidation remains medially. Small loculated right pleural effusion noted laterally. Persistent curvilinear lucency along the right hemidiaphragm suspicious for a small residual right basilar pneumothorax. No enlarging pneumothorax by plain radiography. Right lung remains clear. Normal heart size and vascularity. Trachea is midline. IMPRESSION: Stable right chest tube. Suspect trace residual basilar pneumothorax along the hemidiaphragm Persistent right lower lobe atelectasis/partial consolidation and small loculated lateral right pleural effusion Electronically Signed   By: Judie PetitM.  Shick M.D.   On: 07/05/2018 08:08    Assessment/Plan Active Problems:   Acute on chronic respiratory failure with hypoxia (HCC)   Empyema lung (HCC)   Iatrogenic pneumothorax   Chest tube in place   Right lower lobe pneumonia (HCC)   1. Acute on chronic respiratory failure with hypoxia we will continue with supportive care patient currently not requiring any oxygen. 2. Empyema of the lung status post drainage  and thoracoscopy.  Still with persistent leak present 3. Pneumothorax she still has a significant leak present the pneumothorax itself is minimal at this time. 4. Chest tube I think eventually went on and up having to have surgery take a look and see if any further invasive procedures are necessary to close the leak 5. Right lower lobe pneumonia treated with some atelectasis and partial  consolidation is present.  Would consider CT scan for further evaluation it would also help [place] to evaluate the pneumothorax.   I have personally seen and evaluated the patient, evaluated laboratory and imaging results, formulated the assessment and plan and placed orders. The Patient requires high complexity decision making for assessment and support.  Case was discussed on Rounds with the Respiratory Therapy Staff  Yevonne Pax, MD [facility] Pulmonary Critical Care Medicine Sleep Medicine

## 2018-07-07 LAB — MAGNESIUM: MAGNESIUM: 1.9 mg/dL (ref 1.7–2.4)

## 2018-07-08 ENCOUNTER — Other Ambulatory Visit (HOSPITAL_COMMUNITY): Payer: Medicare Other

## 2018-07-08 DIAGNOSIS — J869 Pyothorax without fistula: Secondary | ICD-10-CM | POA: Diagnosis not present

## 2018-07-08 DIAGNOSIS — J9621 Acute and chronic respiratory failure with hypoxia: Secondary | ICD-10-CM | POA: Diagnosis not present

## 2018-07-08 DIAGNOSIS — J95811 Postprocedural pneumothorax: Secondary | ICD-10-CM | POA: Diagnosis not present

## 2018-07-08 DIAGNOSIS — Z9689 Presence of other specified functional implants: Secondary | ICD-10-CM | POA: Diagnosis not present

## 2018-07-08 LAB — CBC
HCT: 34 % — ABNORMAL LOW (ref 36.0–46.0)
Hemoglobin: 10.3 g/dL — ABNORMAL LOW (ref 12.0–15.0)
MCH: 29.1 pg (ref 26.0–34.0)
MCHC: 30.3 g/dL (ref 30.0–36.0)
MCV: 96 fL (ref 78.0–100.0)
PLATELETS: 564 10*3/uL — AB (ref 150–400)
RBC: 3.54 MIL/uL — ABNORMAL LOW (ref 3.87–5.11)
RDW: 16.3 % — AB (ref 11.5–15.5)
WBC: 11.4 10*3/uL — ABNORMAL HIGH (ref 4.0–10.5)

## 2018-07-08 LAB — BASIC METABOLIC PANEL
Anion gap: 12 (ref 5–15)
BUN: 8 mg/dL (ref 6–20)
CO2: 25 mmol/L (ref 22–32)
CREATININE: 0.54 mg/dL (ref 0.44–1.00)
Calcium: 9.4 mg/dL (ref 8.9–10.3)
Chloride: 105 mmol/L (ref 98–111)
GFR calc Af Amer: >60 mL/min (ref >60)
GLUCOSE: 164 mg/dL — AB (ref 70–99)
POTASSIUM: 4 mmol/L (ref 3.5–5.1)
Sodium: 142 mmol/L (ref 135–145)

## 2018-07-08 LAB — MAGNESIUM: Magnesium: 1.7 mg/dL (ref 1.7–2.4)

## 2018-07-08 NOTE — Progress Notes (Signed)
Pulmonary Critical Care Medicine [facility] [place]   PULMONARY CRITICAL CARE SERVICE  PROGRESS NOTE  Date of Service: 07/08/2018  Kristina Bush  ONG:[id number]  DOB: [birth year]   DOA: 06/22/2018  Referring Physician: Carron Curie, MD  HPI: Kristina Bush is a 57 y.o. female seen for follow up of Acute on Chronic Respiratory Failure.  She is on room air comfortable no distress.  Continues to have a leak in her waterseal.  Medications: Reviewed on Rounds  Physical Exam:  Vitals: 97.4 pulse 90 respiratory rate 18 blood pressure 120/60 saturation 99%  Ventilator Settings off the ventilator  . General: Comfortable at this time . Eyes: Grossly normal lids, irises & conjunctiva . ENT: grossly tongue is normal . Neck: no obvious mass . Cardiovascular: S1 S2 normal no gallop . Respiratory: No rhonchi no rales . Abdomen: soft . Skin: no rash seen on limited exam . Musculoskeletal: not rigid . Psychiatric:unable to assess . Neurologic: no seizure no involuntary movements         Lab Data:   Basic Metabolic Panel: Recent Labs  Lab 07/06/18 0654 07/07/18 0535  NA 142  --   K 3.8  --   CL 106  --   CO2 25  --   GLUCOSE 179*  --   BUN 11  --   CREATININE 0.68  --   CALCIUM 9.1  --   MG 1.7 1.9    ABG: No results for input(s): PHART, PCO2ART, PO2ART, HCO3, O2SAT in the last 168 hours.  Liver Function Tests: No results for input(s): AST, ALT, ALKPHOS, BILITOT, PROT, ALBUMIN in the last 168 hours. No results for input(s): LIPASE, AMYLASE in the last 168 hours. No results for input(s): AMMONIA in the last 168 hours.  CBC: Recent Labs  Lab 07/06/18 0654 07/08/18 1440  WBC 10.6* 11.4*  HGB 9.4* 10.3*  HCT 31.1* 34.0*  MCV 96.6 96.0  PLT 596* 564*    Cardiac Enzymes: No results for input(s): CKTOTAL, CKMB, CKMBINDEX, TROPONINI in the last 168 hours.  BNP (last 3 results) No results for input(s): BNP in the last 8760 hours.  ProBNP (last 3  results) No results for input(s): PROBNP in the last 8760 hours.  Radiological Exams: Dg Chest Port 1 View  Result Date: 07/08/2018 CLINICAL DATA:  Chest tube and empyema EXAM: PORTABLE CHEST 1 VIEW COMPARISON:  07/05/2018 FINDINGS: Heart is normal in size. Lungs remain under aerated. There are 2 right chest tubes. Small residual right basilar pneumothorax. Patchy opacities at the right lung base are stable. Loculated pleural effusion along the right chest sidewall is stable. Left lung remains clear. IMPRESSION: Stable appearance of the right hemithorax with chest tubes and a tiny basilar pneumothorax. Stable patchy opacities at the right base and small loculated right pleural effusion. Electronically Signed   By: Jolaine Click M.D.   On: 07/08/2018 09:36    Assessment/Plan Active Problems:   Acute on chronic respiratory failure with hypoxia (HCC)   Empyema lung (HCC)   Iatrogenic pneumothorax   Chest tube in place   Right lower lobe pneumonia (HCC)   1. Acute on chronic respiratory failure clinically resolved suspect stable at this time. 2. Pneumothorax remains still with a small pneumothorax and a leak needs to have thoracic surgery evaluate 3. Chest tube remains in place 4. Right lower lobe pneumonia has been treated monitor x-rays  I discussed the case with the primary care team they are going to try to make arrangement  for her to be transferred for having thoracic surgery to evaluate  I have personally seen and evaluated the patient, evaluated laboratory and imaging results, formulated the assessment and plan and placed orders. The Patient requires high complexity decision making for assessment and support.  Case was discussed on Rounds with the Respiratory Therapy Staff  Yevonne Pax, MD [facility] Pulmonary Critical Care Medicine Sleep Medicine

## 2018-07-09 DIAGNOSIS — J9621 Acute and chronic respiratory failure with hypoxia: Secondary | ICD-10-CM | POA: Diagnosis not present

## 2018-07-09 DIAGNOSIS — J95811 Postprocedural pneumothorax: Secondary | ICD-10-CM | POA: Diagnosis not present

## 2018-07-09 DIAGNOSIS — J869 Pyothorax without fistula: Secondary | ICD-10-CM | POA: Diagnosis not present

## 2018-07-09 DIAGNOSIS — Z9689 Presence of other specified functional implants: Secondary | ICD-10-CM | POA: Diagnosis not present

## 2018-07-09 NOTE — Progress Notes (Signed)
Pulmonary Critical Care Medicine [facility] [place]   PULMONARY CRITICAL CARE SERVICE  PROGRESS NOTE  Date of Service: 07/09/2018  Kristina Bush  GMW:[id number]  DOB: 1961/06/21   DOA: 06/22/2018  Referring Physician: Carron Curie, MD  HPI: Kristina Bush is a 57 y.o. female seen for follow up of Acute on Chronic Respiratory Failure.  She is comfortable at this time still continues to have a leak.  The primary care team spoke with the surgeon who took care of the patient and they recommended clamping the tube and assessing if the leak is in the canister or if the leak is actually at the chest tube site.  Site is also going to be redressed with Vaseline gauze.  Medications: Reviewed on Rounds  Physical Exam:  Vitals: Temperature 98.9 pulse 90 respiratory rate 20 blood pressure 110/70 saturations 98%  Ventilator Settings currently is off the ventilator  . General: Comfortable at this time . Eyes: Grossly normal lids, irises & conjunctiva . ENT: grossly tongue is normal . Neck: no obvious mass . Cardiovascular: S1 S2 normal no gallop . Respiratory: No rhonchi no rales . Abdomen: soft . Skin: no rash seen on limited exam . Musculoskeletal: not rigid . Psychiatric:unable to assess . Neurologic: no seizure no involuntary movements         Lab Data:   Basic Metabolic Panel: Recent Labs  Lab 07/06/18 0654 07/07/18 0535 07/08/18 1440  NA 142  --  142  K 3.8  --  4.0  CL 106  --  105  CO2 25  --  25  GLUCOSE 179*  --  164*  BUN 11  --  8  CREATININE 0.68  --  0.54  CALCIUM 9.1  --  9.4  MG 1.7 1.9 1.7    ABG: No results for input(s): PHART, PCO2ART, PO2ART, HCO3, O2SAT in the last 168 hours.  Liver Function Tests: No results for input(s): AST, ALT, ALKPHOS, BILITOT, PROT, ALBUMIN in the last 168 hours. No results for input(s): LIPASE, AMYLASE in the last 168 hours. No results for input(s): AMMONIA in the last 168 hours.  CBC: Recent Labs  Lab  07/06/18 0654 07/08/18 1440  WBC 10.6* 11.4*  HGB 9.4* 10.3*  HCT 31.1* 34.0*  MCV 96.6 96.0  PLT 596* 564*    Cardiac Enzymes: No results for input(s): CKTOTAL, CKMB, CKMBINDEX, TROPONINI in the last 168 hours.  BNP (last 3 results) No results for input(s): BNP in the last 8760 hours.  ProBNP (last 3 results) No results for input(s): PROBNP in the last 8760 hours.  Radiological Exams: Dg Chest Port 1 View  Result Date: 07/08/2018 CLINICAL DATA:  Chest tube and empyema EXAM: PORTABLE CHEST 1 VIEW COMPARISON:  07/05/2018 FINDINGS: Heart is normal in size. Lungs remain under aerated. There are 2 right chest tubes. Small residual right basilar pneumothorax. Patchy opacities at the right lung base are stable. Loculated pleural effusion along the right chest sidewall is stable. Left lung remains clear. IMPRESSION: Stable appearance of the right hemithorax with chest tubes and a tiny basilar pneumothorax. Stable patchy opacities at the right base and small loculated right pleural effusion. Electronically Signed   By: Jolaine Click M.D.   On: 07/08/2018 09:36    Assessment/Plan Active Problems:   Acute on chronic respiratory failure with hypoxia (HCC)   Empyema lung (HCC)   Iatrogenic pneumothorax   Chest tube in place   Right lower lobe pneumonia (HCC)   1. Acute on  chronic respiratory failure with hypoxia we will continue with full supportive care.  Patient's on room air. 2. Iatrogenic pneumothorax.  Still a basilar pneumothorax present will continue to monitor. 3. Empyema status post drainage 4. Chest tube in place being adjusted to see if we can get rid of this leak 5. Right lower lobe pneumonia treated we will continue to monitor   I have personally seen and evaluated the patient, evaluated laboratory and imaging results, formulated the assessment and plan and placed orders. The Patient requires high complexity decision making for assessment and support.  Case was discussed on  Rounds with the Respiratory Therapy Staff  Yevonne Pax, MD [facility] Pulmonary Critical Care Medicine Sleep Medicine

## 2018-07-10 ENCOUNTER — Other Ambulatory Visit (HOSPITAL_COMMUNITY): Payer: Medicare Other

## 2018-07-10 LAB — MAGNESIUM: Magnesium: 1.8 mg/dL (ref 1.7–2.4)

## 2018-07-12 ENCOUNTER — Other Ambulatory Visit (HOSPITAL_COMMUNITY): Payer: Medicare Other

## 2018-07-12 DIAGNOSIS — Z9689 Presence of other specified functional implants: Secondary | ICD-10-CM | POA: Diagnosis not present

## 2018-07-12 DIAGNOSIS — J9621 Acute and chronic respiratory failure with hypoxia: Secondary | ICD-10-CM | POA: Diagnosis not present

## 2018-07-12 DIAGNOSIS — J95811 Postprocedural pneumothorax: Secondary | ICD-10-CM | POA: Diagnosis not present

## 2018-07-12 DIAGNOSIS — J869 Pyothorax without fistula: Secondary | ICD-10-CM | POA: Diagnosis not present

## 2018-07-12 NOTE — Progress Notes (Signed)
Pulmonary Critical Care Medicine [facility] [place]   PULMONARY CRITICAL CARE SERVICE  PROGRESS NOTE  Date of Service: 07/12/2018  Kristina Bush  ZOX:[id number]  DOB: 1961/01/28   DOA: 06/22/2018  Referring Physician: Carron Curie, MD  HPI: Kristina Bush is a 57 y.o. female seen for follow up of Acute on Chronic Respiratory Failure.  Doing very well no distress.  She remains on room air  Medications: Reviewed on Rounds  Physical Exam:  Vitals: Temperature 97.7 pulse 85 respiratory rate 20 blood pressure 135/78 saturations 96%  Ventilator Settings patient is on room air at this time off the ventilator  . General: Comfortable at this time . Eyes: Grossly normal lids, irises & conjunctiva . ENT: grossly tongue is normal . Neck: no obvious mass . Cardiovascular: S1 S2 normal no gallop . Respiratory: No rhonchi no rales . Abdomen: soft . Skin: no rash seen on limited exam . Musculoskeletal: not rigid . Psychiatric:unable to assess . Neurologic: no seizure no involuntary movements         Lab Data:   Basic Metabolic Panel: Recent Labs  Lab 07/06/18 0654 07/07/18 0535 07/08/18 1440 07/10/18 0645  NA 142  --  142  --   K 3.8  --  4.0  --   CL 106  --  105  --   CO2 25  --  25  --   GLUCOSE 179*  --  164*  --   BUN 11  --  8  --   CREATININE 0.68  --  0.54  --   CALCIUM 9.1  --  9.4  --   MG 1.7 1.9 1.7 1.8    ABG: No results for input(s): PHART, PCO2ART, PO2ART, HCO3, O2SAT in the last 168 hours.  Liver Function Tests: No results for input(s): AST, ALT, ALKPHOS, BILITOT, PROT, ALBUMIN in the last 168 hours. No results for input(s): LIPASE, AMYLASE in the last 168 hours. No results for input(s): AMMONIA in the last 168 hours.  CBC: Recent Labs  Lab 07/06/18 0654 07/08/18 1440  WBC 10.6* 11.4*  HGB 9.4* 10.3*  HCT 31.1* 34.0*  MCV 96.6 96.0  PLT 596* 564*    Cardiac Enzymes: No results for input(s): CKTOTAL, CKMB, CKMBINDEX,  TROPONINI in the last 168 hours.  BNP (last 3 results) No results for input(s): BNP in the last 8760 hours.  ProBNP (last 3 results) No results for input(s): PROBNP in the last 8760 hours.  Radiological Exams: Dg Chest Port 1 View  Result Date: 07/12/2018 CLINICAL DATA:  Followup pneumothorax. EXAM: PORTABLE CHEST 1 VIEW COMPARISON:  07/10/2018 and older exams. FINDINGS: The dual right-sided chest tubes are stable. Pneumothorax noted along the medial right lung base on the prior study is not evident on the current exam. There is persistent opacity at the right lung base and pleural based opacity along the right mid to lower hemithorax, without significant change previous day's exam. Remainder of the right lung is clear. Clear left lung. IMPRESSION: 1. No convincing residual pneumothorax. 2. Persistent right lung base opacity consistent with atelectasis, infection or a combination. 3. No change in the 2 right-sided chest tubes. Electronically Signed   By: Amie Portland M.D.   On: 07/12/2018 08:29   Dg Chest Port 1 View  Result Date: 07/10/2018 CLINICAL DATA:  Chest tubes in place for pneumothorax EXAM: PORTABLE CHEST 1 VIEW COMPARISON:  July 08, 2018 FINDINGS: Chest tubes remain in position on the right, stable. There is a  small pneumothorax on the right medially and in the right base, stable. There is a minimal right pleural effusion with consolidation in the medial right base. Left lung is clear. Heart size and pulmonary vascularity normal. No adenopathy. No bone lesions. There is a small hiatal hernia. IMPRESSION: Chest tubes on the right remain in position with persistent small pneumothorax on the right medially and in the right base. There is a small right pleural effusion with right base consolidation medially. A degree of pneumonia in the right base is suspected. Left lung is clear. Cardiac silhouette normal. There is a small hiatal hernia. Electronically Signed   By: Bretta Bang III  M.D.   On: 07/10/2018 15:28    Assessment/Plan Active Problems:   Acute on chronic respiratory failure with hypoxia (HCC)   Empyema lung (HCC)   Iatrogenic pneumothorax   Chest tube in place   Right lower lobe pneumonia (HCC)   1. Acute on chronic respiratory failure with hypoxia patient is comfortable will place the patient on waterseal at this time 2. Empyema follow-up x-ray had shown good position of the tube 3. Iatrogenic visit pneumothorax there is a small right pleural effusion and is a persistent small pneumothorax on the right. 4. Chest tube will be placed to waterseal and follow-up chest x-ray 5. Right lower lobe pneumonia treated we will monitor   I have personally seen and evaluated the patient, evaluated laboratory and imaging results, formulated the assessment and plan and placed orders. The Patient requires high complexity decision making for assessment and support.  Case was discussed on Rounds with the Respiratory Therapy Staff  Yevonne Pax, MD [facility] Pulmonary Critical Care Medicine Sleep Medicine

## 2018-07-13 DIAGNOSIS — Z9689 Presence of other specified functional implants: Secondary | ICD-10-CM | POA: Diagnosis not present

## 2018-07-13 DIAGNOSIS — J9621 Acute and chronic respiratory failure with hypoxia: Secondary | ICD-10-CM | POA: Diagnosis not present

## 2018-07-13 DIAGNOSIS — J95811 Postprocedural pneumothorax: Secondary | ICD-10-CM | POA: Diagnosis not present

## 2018-07-13 DIAGNOSIS — J869 Pyothorax without fistula: Secondary | ICD-10-CM | POA: Diagnosis not present

## 2018-07-13 NOTE — Progress Notes (Signed)
Pulmonary Critical Care Medicine [facility] [place]   PULMONARY CRITICAL CARE SERVICE  PROGRESS NOTE  Date of Service: 07/13/2018  Kristina Bush  ZOX:[id number]  DOB: [birth year]   DOA: 06/22/2018  Referring Physician: Carron Curie, MD  HPI: Kristina Bush is a 57 y.o. female seen for follow up of Acute on Chronic Respiratory Failure.  She is comfortable on room air off the ventilator chest tube still in place apparently was placed back on suction overnight.  I asked nursing staff to find out what exactly happened her x-ray had actually looked good as far as the pneumothorax was concerned  Medications: Reviewed on Rounds  Physical Exam:  Vitals: Temperature 98.6 pulse 75 respiratory rate 16 blood pressure 136/56 saturation 97%  Ventilator Settings off the ventilator  . General: Comfortable at this time . Eyes: Grossly normal lids, irises & conjunctiva . ENT: grossly tongue is normal . Neck: no obvious mass . Cardiovascular: S1 S2 normal no gallop . Respiratory: No rhonchi no rales are noted . Abdomen: soft . Skin: no rash seen on limited exam . Musculoskeletal: not rigid . Psychiatric:unable to assess . Neurologic: no seizure no involuntary movements         Lab Data:   Basic Metabolic Panel: Recent Labs  Lab 07/07/18 0535 07/08/18 1440 07/10/18 0645  NA  --  142  --   K  --  4.0  --   CL  --  105  --   CO2  --  25  --   GLUCOSE  --  164*  --   BUN  --  8  --   CREATININE  --  0.54  --   CALCIUM  --  9.4  --   MG 1.9 1.7 1.8    ABG: No results for input(s): PHART, PCO2ART, PO2ART, HCO3, O2SAT in the last 168 hours.  Liver Function Tests: No results for input(s): AST, ALT, ALKPHOS, BILITOT, PROT, ALBUMIN in the last 168 hours. No results for input(s): LIPASE, AMYLASE in the last 168 hours. No results for input(s): AMMONIA in the last 168 hours.  CBC: Recent Labs  Lab 07/08/18 1440  WBC 11.4*  HGB 10.3*  HCT 34.0*  MCV 96.0  PLT  564*    Cardiac Enzymes: No results for input(s): CKTOTAL, CKMB, CKMBINDEX, TROPONINI in the last 168 hours.  BNP (last 3 results) No results for input(s): BNP in the last 8760 hours.  ProBNP (last 3 results) No results for input(s): PROBNP in the last 8760 hours.  Radiological Exams: Dg Chest Port 1 View  Result Date: 07/12/2018 CLINICAL DATA:  RIGHT chest tube, pneumothorax, follow-up EXAM: PORTABLE CHEST 1 VIEW COMPARISON:  Portable exam 1527 hours compared to 0649 hours FINDINGS: Pair of RIGHT thoracostomy tubes identified, with the proximal side-port of the more cranial thoracostomy tube located at the costal margin. Upper normal heart size. LEFT paraspinal density at inferior chest potentially hiatal hernia or tortuous thoracic aorta though mass not completely excluded. Remaining mediastinal contours and pulmonary vascularity normal. Atherosclerotic calcifications aortic arch. Atelectasis RIGHT lower lobe. No pleural effusion or definite pneumothorax. IMPRESSION: Persistent RIGHT basilar atelectasis without definite pneumothorax. Proximal side-port of the more cranial chest tube is located at the costal margin. Question hiatal hernia versus tortuous thoracic aorta accounting for inferior mediastinal density; recommend attention on follow-up imaging. Electronically Signed   By: Ulyses Southward M.D.   On: 07/12/2018 15:43   Dg Chest Port 1 View  Result Date: 07/12/2018 CLINICAL DATA:  Followup pneumothorax. EXAM: PORTABLE CHEST 1 VIEW COMPARISON:  07/10/2018 and older exams. FINDINGS: The dual right-sided chest tubes are stable. Pneumothorax noted along the medial right lung base on the prior study is not evident on the current exam. There is persistent opacity at the right lung base and pleural based opacity along the right mid to lower hemithorax, without significant change previous day's exam. Remainder of the right lung is clear. Clear left lung. IMPRESSION: 1. No convincing residual  pneumothorax. 2. Persistent right lung base opacity consistent with atelectasis, infection or a combination. 3. No change in the 2 right-sided chest tubes. Electronically Signed   By: Amie Portland M.D.   On: 07/12/2018 08:29    Assessment/Plan Active Problems:   Acute on chronic respiratory failure with hypoxia (HCC)   Empyema lung (HCC)   Iatrogenic pneumothorax   Chest tube in place   Right lower lobe pneumonia (HCC)   1. Acute on chronic respiratory failure with hypoxia continue with supportive care right now is on room air 2. Empyema of the lung treated continue to monitor 3. Iatrogenic pneumothorax the last chest x-ray which was done on waterseal did not show any residual pneumothorax however she was placed back on suction overnight so we have to put her back on waterseal again 4. Chest tube trying to work towards removing it 5. Right lower treated we will continue to follow   I have personally seen and evaluated the patient, evaluated laboratory and imaging results, formulated the assessment and plan and placed orders. The Patient requires high complexity decision making for assessment and support.  Case was discussed on Rounds with the Respiratory Therapy Staff  Yevonne Pax, MD [facility] Pulmonary Critical Care Medicine Sleep Medicine

## 2018-07-14 DIAGNOSIS — J95811 Postprocedural pneumothorax: Secondary | ICD-10-CM | POA: Diagnosis not present

## 2018-07-14 DIAGNOSIS — J9621 Acute and chronic respiratory failure with hypoxia: Secondary | ICD-10-CM | POA: Diagnosis not present

## 2018-07-14 DIAGNOSIS — Z9689 Presence of other specified functional implants: Secondary | ICD-10-CM | POA: Diagnosis not present

## 2018-07-14 DIAGNOSIS — J869 Pyothorax without fistula: Secondary | ICD-10-CM | POA: Diagnosis not present

## 2018-07-14 LAB — PHOSPHORUS: Phosphorus: 4.7 mg/dL — ABNORMAL HIGH (ref 2.5–4.6)

## 2018-07-14 LAB — CBC
HCT: 34.2 % — ABNORMAL LOW (ref 36.0–46.0)
Hemoglobin: 10.2 g/dL — ABNORMAL LOW (ref 12.0–15.0)
MCH: 28.7 pg (ref 26.0–34.0)
MCHC: 29.8 g/dL — ABNORMAL LOW (ref 30.0–36.0)
MCV: 96.1 fL (ref 78.0–100.0)
PLATELETS: 452 10*3/uL — AB (ref 150–400)
RBC: 3.56 MIL/uL — AB (ref 3.87–5.11)
RDW: 15.7 % — ABNORMAL HIGH (ref 11.5–15.5)
WBC: 7.4 10*3/uL (ref 4.0–10.5)

## 2018-07-14 LAB — BASIC METABOLIC PANEL
ANION GAP: 5 (ref 5–15)
BUN: 10 mg/dL (ref 6–20)
CO2: 25 mmol/L (ref 22–32)
Calcium: 8.5 mg/dL — ABNORMAL LOW (ref 8.9–10.3)
Chloride: 108 mmol/L (ref 98–111)
Creatinine, Ser: 0.54 mg/dL (ref 0.44–1.00)
GFR calc Af Amer: >60 mL/min (ref >60)
GLUCOSE: 119 mg/dL — AB (ref 70–99)
POTASSIUM: 3.5 mmol/L (ref 3.5–5.1)
Sodium: 138 mmol/L (ref 135–145)

## 2018-07-14 LAB — MAGNESIUM: Magnesium: 1.8 mg/dL (ref 1.7–2.4)

## 2018-07-14 NOTE — Progress Notes (Signed)
Pulmonary Critical Care Medicine [facility] [place]   PULMONARY CRITICAL CARE SERVICE  PROGRESS NOTE  Date of Service: 07/14/2018  Kristina Bush  WUJ:[id number]  DOB: [birth year]   DOA: 06/22/2018  Referring Physician: Carron Curie, MD  HPI: Kristina Bush is a 57 y.o. female seen for follow up of Acute on Chronic Respiratory Failure.  Patient denies on waterseal doing well.  No distress noted hopefully we should be able to move towards removal of the chest tube  Medications: Reviewed on Rounds  Physical Exam:  Vitals: Temperature 98.2 pulse 92 respiratory rate 16 blood pressure 135/79 saturations are 97%  Ventilator Settings off the ventilator on room air  . General: Comfortable at this time . Eyes: Grossly normal lids, irises & conjunctiva . ENT: grossly tongue is normal . Neck: no obvious mass . Cardiovascular: S1 S2 normal no gallop . Respiratory: No rhonchi or rales are noted at this time . Abdomen: soft . Skin: no rash seen on limited exam . Musculoskeletal: not rigid . Psychiatric:unable to assess . Neurologic: no seizure no involuntary movements         Lab Data:   Basic Metabolic Panel: Recent Labs  Lab 07/08/18 1440 07/10/18 0645 07/14/18 0538  NA 142  --  138  K 4.0  --  3.5  CL 105  --  108  CO2 25  --  25  GLUCOSE 164*  --  119*  BUN 8  --  10  CREATININE 0.54  --  0.54  CALCIUM 9.4  --  8.5*  MG 1.7 1.8 1.8  PHOS  --   --  4.7*    ABG: No results for input(s): PHART, PCO2ART, PO2ART, HCO3, O2SAT in the last 168 hours.  Liver Function Tests: No results for input(s): AST, ALT, ALKPHOS, BILITOT, PROT, ALBUMIN in the last 168 hours. No results for input(s): LIPASE, AMYLASE in the last 168 hours. No results for input(s): AMMONIA in the last 168 hours.  CBC: Recent Labs  Lab 07/08/18 1440 07/14/18 0538  WBC 11.4* 7.4  HGB 10.3* 10.2*  HCT 34.0* 34.2*  MCV 96.0 96.1  PLT 564* 452*    Cardiac Enzymes: No results for  input(s): CKTOTAL, CKMB, CKMBINDEX, TROPONINI in the last 168 hours.  BNP (last 3 results) No results for input(s): BNP in the last 8760 hours.  ProBNP (last 3 results) No results for input(s): PROBNP in the last 8760 hours.  Radiological Exams: Dg Chest Port 1 View  Result Date: 07/12/2018 CLINICAL DATA:  RIGHT chest tube, pneumothorax, follow-up EXAM: PORTABLE CHEST 1 VIEW COMPARISON:  Portable exam 1527 hours compared to 0649 hours FINDINGS: Pair of RIGHT thoracostomy tubes identified, with the proximal side-port of the more cranial thoracostomy tube located at the costal margin. Upper normal heart size. LEFT paraspinal density at inferior chest potentially hiatal hernia or tortuous thoracic aorta though mass not completely excluded. Remaining mediastinal contours and pulmonary vascularity normal. Atherosclerotic calcifications aortic arch. Atelectasis RIGHT lower lobe. No pleural effusion or definite pneumothorax. IMPRESSION: Persistent RIGHT basilar atelectasis without definite pneumothorax. Proximal side-port of the more cranial chest tube is located at the costal margin. Question hiatal hernia versus tortuous thoracic aorta accounting for inferior mediastinal density; recommend attention on follow-up imaging. Electronically Signed   By: Ulyses Southward M.D.   On: 07/12/2018 15:43    Assessment/Plan Active Problems:   Acute on chronic respiratory failure with hypoxia (HCC)   Empyema lung (HCC)   Iatrogenic pneumothorax   Chest  tube in place   Right lower lobe pneumonia (HCC)   1. Acute on chronic respiratory failure with hypoxia continue with waterseal hopefully will be looking at removing the chest tube soon.  Last chest x-ray showed no significant pneumothorax. 2. Empyema drained we will continue with supportive care 3. Iatrogenic pneumothorax as above chest tube on waterseal 4. Chest tube in place we are working towards removal 5. Right lower lobe pneumonia treated we will continue to  follow   I have personally seen and evaluated the patient, evaluated laboratory and imaging results, formulated the assessment and plan and placed orders. The Patient requires high complexity decision making for assessment and support.  Case was discussed on Rounds with the Respiratory Therapy Staff  Yevonne Pax, MD [facility] Pulmonary Critical Care Medicine Sleep Medicine

## 2018-07-15 ENCOUNTER — Other Ambulatory Visit (HOSPITAL_COMMUNITY): Payer: Medicare Other

## 2018-07-15 DIAGNOSIS — J95811 Postprocedural pneumothorax: Secondary | ICD-10-CM | POA: Diagnosis not present

## 2018-07-15 DIAGNOSIS — J869 Pyothorax without fistula: Secondary | ICD-10-CM | POA: Diagnosis not present

## 2018-07-15 DIAGNOSIS — J9621 Acute and chronic respiratory failure with hypoxia: Secondary | ICD-10-CM | POA: Diagnosis not present

## 2018-07-15 DIAGNOSIS — Z9689 Presence of other specified functional implants: Secondary | ICD-10-CM | POA: Diagnosis not present

## 2018-07-15 NOTE — Progress Notes (Signed)
Pulmonary Critical Care Medicine [facility] [place]   PULMONARY CRITICAL CARE SERVICE  PROGRESS NOTE  Date of Service: 07/15/2018  Makaila Windle Starry  ZOX:[id number]  DOB: 02/14/1961   DOA: 06/22/2018  Referring Physician: Carron Curie, MD  HPI: Kristina Bush is a 57 y.o. female seen for follow up of Acute on Chronic Respiratory Failure.  Patient is comfortable without distress.  Need to order a follow-up chest x-ray today which was done  Medications: Reviewed on Rounds  Physical Exam:  Vitals: Temperature 98.0 pulse 76 respiratory rate 16 blood pressure 120/56 saturations 98%  Ventilator Settings the ventilator at this time  . General: Comfortable at this time . Eyes: Grossly normal lids, irises & conjunctiva . ENT: grossly tongue is normal . Neck: no obvious mass . Cardiovascular: S1 S2 normal no gallop . Respiratory: No rhonchi or rales . Abdomen: soft . Skin: no rash seen on limited exam . Musculoskeletal: not rigid . Psychiatric:unable to assess . Neurologic: no seizure no involuntary movements         Lab Data:   Basic Metabolic Panel: Recent Labs  Lab 07/10/18 0645 07/14/18 0538  NA  --  138  K  --  3.5  CL  --  108  CO2  --  25  GLUCOSE  --  119*  BUN  --  10  CREATININE  --  0.54  CALCIUM  --  8.5*  MG 1.8 1.8  PHOS  --  4.7*    ABG: No results for input(s): PHART, PCO2ART, PO2ART, HCO3, O2SAT in the last 168 hours.  Liver Function Tests: No results for input(s): AST, ALT, ALKPHOS, BILITOT, PROT, ALBUMIN in the last 168 hours. No results for input(s): LIPASE, AMYLASE in the last 168 hours. No results for input(s): AMMONIA in the last 168 hours.  CBC: Recent Labs  Lab 07/14/18 0538  WBC 7.4  HGB 10.2*  HCT 34.2*  MCV 96.1  PLT 452*    Cardiac Enzymes: No results for input(s): CKTOTAL, CKMB, CKMBINDEX, TROPONINI in the last 168 hours.  BNP (last 3 results) No results for input(s): BNP in the last 8760 hours.  ProBNP  (last 3 results) No results for input(s): PROBNP in the last 8760 hours.  Radiological Exams: Dg Chest Port 1 View  Result Date: 07/15/2018 CLINICAL DATA:  57 year old female status post decortication of right lung empyema on 06/18/2018. Sepsis. 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 EXAM: PORTABLE CHEST 1 VIEW COMPARISON:  07/12/2018 and earlier. FINDINGS: Portable AP semi upright view at 1122 hours. Two right chest tubes remain in place, although one of the tube side hole projects over the level of the ribs as before (arrow). No pneumothorax identified. Stable lung volumes. Stable patchy right lung base opacity. The left lung remains clear when allowing for portable technique. Mediastinal contours remain within normal limits. Visualized tracheal air column is within normal limits. Negative visible bowel gas pattern. No acute osseous abnormality identified. IMPRESSION: 1. One of the 2 right chest tubes has been pulled back with side hole now outside the pleural space (arrow). 2. The 2nd right chest tube remains in good position. No pneumothorax identified. 3. Stable patchy right lung base opacity with no new cardiopulmonary abnormality. Electronically Signed   By: Odessa Fleming M.D.   On: 07/15/2018 11:46    Assessment/Plan Active Problems:   Acute on chronic respiratory failure with hypoxia (HCC)   Empyema lung (HCC)   Iatrogenic pneumothorax   Chest tube in place   Right  lower lobe pneumonia (HCC)   1. Acute on chronic respiratory failure with hypoxia patient is comfortable right now is on room air. 2. Pneumothorax the one chest tube is actually already outside the pleural space and on the second chest tube is in good position does not show any pneumothorax.  Patient has been on waterseal should be able to hopefully remove the chest tubes at this point. 3. Chest tube as discussed will proceed to removal of the chest tube. 4. Right lower lobe pneumonia treated we will continue with  supportive care   I have personally seen and evaluated the patient, evaluated laboratory and imaging results, formulated the assessment and plan and placed orders. The Patient requires high complexity decision making for assessment and support.  Case was discussed on Rounds with the Respiratory Therapy Staff  Yevonne Pax, MD [facility] Pulmonary Critical Care Medicine Sleep Medicine

## 2018-07-16 ENCOUNTER — Other Ambulatory Visit (HOSPITAL_COMMUNITY): Payer: Medicare Other

## 2018-07-16 DIAGNOSIS — J9621 Acute and chronic respiratory failure with hypoxia: Secondary | ICD-10-CM | POA: Diagnosis not present

## 2018-07-16 DIAGNOSIS — Z9689 Presence of other specified functional implants: Secondary | ICD-10-CM | POA: Diagnosis not present

## 2018-07-16 DIAGNOSIS — J95811 Postprocedural pneumothorax: Secondary | ICD-10-CM | POA: Diagnosis not present

## 2018-07-16 DIAGNOSIS — J869 Pyothorax without fistula: Secondary | ICD-10-CM | POA: Diagnosis not present

## 2018-07-16 LAB — CBC
HCT: 35.6 % — ABNORMAL LOW (ref 36.0–46.0)
Hemoglobin: 10.9 g/dL — ABNORMAL LOW (ref 12.0–15.0)
MCH: 29.1 pg (ref 26.0–34.0)
MCHC: 30.6 g/dL (ref 30.0–36.0)
MCV: 95.2 fL (ref 78.0–100.0)
Platelets: 425 10*3/uL — ABNORMAL HIGH (ref 150–400)
RBC: 3.74 MIL/uL — ABNORMAL LOW (ref 3.87–5.11)
RDW: 15.3 % (ref 11.5–15.5)
WBC: 7.6 10*3/uL (ref 4.0–10.5)

## 2018-07-16 NOTE — Progress Notes (Signed)
Pulmonary Critical Care Medicine [facility] [place]   PULMONARY CRITICAL CARE SERVICE  PROGRESS NOTE  Date of Service: 07/16/2018  Kristina Bush  [id number]  DOB: 04/06/1961   DOA: 06/22/2018  Referring Physician: Carron Curie, MD  HPI: Kristina Bush is a 57 y.o. female seen for follow up of Acute on Chronic Respiratory Failure.  Patient is doing well she had a chest x-ray done yesterday which showed no residual pneumothorax in fact 1 of the chest tubes was out of the pleural space.  Today she remains on waterseal no issues noted.  Medications: Reviewed on Rounds  Physical Exam:  Vitals: Temperature 97.5 pulse 96 respiratory rate 19 blood pressure 116/83 last saturations were 95%  Ventilator Settings off the ventilator  . General: Comfortable at this time . Eyes: Grossly normal lids, irises & conjunctiva . ENT: grossly tongue is normal . Neck: no obvious mass . Cardiovascular: S1 S2 normal no gallop . Respiratory: No rhonchi or rales good air entry . Abdomen: soft . Skin: no rash seen on limited exam . Musculoskeletal: not rigid . Psychiatric:unable to assess . Neurologic: no seizure no involuntary movements         Lab Data:   Basic Metabolic Panel: Recent Labs  Lab 07/10/18 0645 07/14/18 0538  NA  --  138  K  --  3.5  CL  --  108  CO2  --  25  GLUCOSE  --  119*  BUN  --  10  CREATININE  --  0.54  CALCIUM  --  8.5*  MG 1.8 1.8  PHOS  --  4.7*    ABG: No results for input(s): PHART, PCO2ART, PO2ART, HCO3, O2SAT in the last 168 hours.  Liver Function Tests: No results for input(s): AST, ALT, ALKPHOS, BILITOT, PROT, ALBUMIN in the last 168 hours. No results for input(s): LIPASE, AMYLASE in the last 168 hours. No results for input(s): AMMONIA in the last 168 hours.  CBC: Recent Labs  Lab 07/14/18 0538 07/16/18 0705  WBC 7.4 7.6  HGB 10.2* 10.9*  HCT 34.2* 35.6*  MCV 96.1 95.2  PLT 452* 425*    Cardiac Enzymes: No results  for input(s): CKTOTAL, CKMB, CKMBINDEX, TROPONINI in the last 168 hours.  BNP (last 3 results) No results for input(s): BNP in the last 8760 hours.  ProBNP (last 3 results) No results for input(s): PROBNP in the last 8760 hours.  Radiological Exams: Dg Chest Port 1 View  Result Date: 07/16/2018 CLINICAL DATA:  Pneumothorax. EXAM: PORTABLE CHEST 1 VIEW COMPARISON:  Radiograph of July 15, 2018. FINDINGS: The heart size and mediastinal contours are within normal limits. Stable position of 2 right-sided chest tubes, with side hole of 1 chest tube outside of thoracic space. No definite pneumothorax is noted. Stable right basilar subsegmental atelectasis is noted with minimal pleural effusion. Left lung is clear. The visualized skeletal structures are unremarkable. IMPRESSION: Stable position of 2 right-sided chest tubes, with side hole of 1 chest tube outside of thoracic space. Stable right basilar subsegmental atelectasis is noted with minimal pleural effusion. Electronically Signed   By: Lupita Raider, M.D.   On: 07/16/2018 07:22   Dg Chest Port 1 View  Result Date: 07/15/2018 CLINICAL DATA:  57 year old female status post decortication of right lung empyema on 06/18/2018. Sepsis. 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 EXAM: PORTABLE CHEST 1 VIEW COMPARISON:  07/12/2018 and earlier. FINDINGS: Portable AP semi upright view at 1122 hours. Two right chest tubes remain in place,  although one of the tube side hole projects over the level of the ribs as before (arrow). No pneumothorax identified. Stable lung volumes. Stable patchy right lung base opacity. The left lung remains clear when allowing for portable technique. Mediastinal contours remain within normal limits. Visualized tracheal air column is within normal limits. Negative visible bowel gas pattern. No acute osseous abnormality identified. IMPRESSION: 1. One of the 2 right chest tubes has been pulled back with side hole now outside the  pleural space (arrow). 2. The 2nd right chest tube remains in good position. No pneumothorax identified. 3. Stable patchy right lung base opacity with no new cardiopulmonary abnormality. Electronically Signed   By: Odessa Fleming M.D.   On: 07/15/2018 11:46    Assessment/Plan Active Problems:   Acute on chronic respiratory failure with hypoxia (HCC)   Empyema lung (HCC)   Iatrogenic pneumothorax   Chest tube in place   Right lower lobe pneumonia (HCC)   1. Acute on chronic respiratory failure with hypoxia we will continue with supportive care no oxygen as necessary 2. Empyema treated we will continue to monitor x-rays 3. Iatrogenic pneumothorax resolved we will proceed to remove both chest tubes 4. Chest tubes as above proceed to remove both chest tubes 5. Right lower lobe pneumonia treated   I have personally seen and evaluated the patient, evaluated laboratory and imaging results, formulated the assessment and plan and placed orders. The Patient requires high complexity decision making for assessment and support.  Case was discussed on Rounds with the Respiratory Therapy Staff  Yevonne Pax, MD [facility] Pulmonary Critical Care Medicine Sleep Medicine

## 2018-07-17 ENCOUNTER — Other Ambulatory Visit (HOSPITAL_COMMUNITY): Payer: Medicare Other

## 2018-07-17 LAB — HEMOGLOBIN A1C
HEMOGLOBIN A1C: 5.8 % — AB (ref 4.8–5.6)
Mean Plasma Glucose: 120 mg/dL

## 2020-07-08 IMAGING — DX DG CHEST 1V PORT
1 series · 1 of 1 positions shown · non-contrast
Comparison: 06/27/2018 chest x-ray.

CLINICAL DATA: 57-year-old female with pleural effusion and chest
tubes. Subsequent encounter.

EXAM:
PORTABLE CHEST 1 VIEW

[chest]
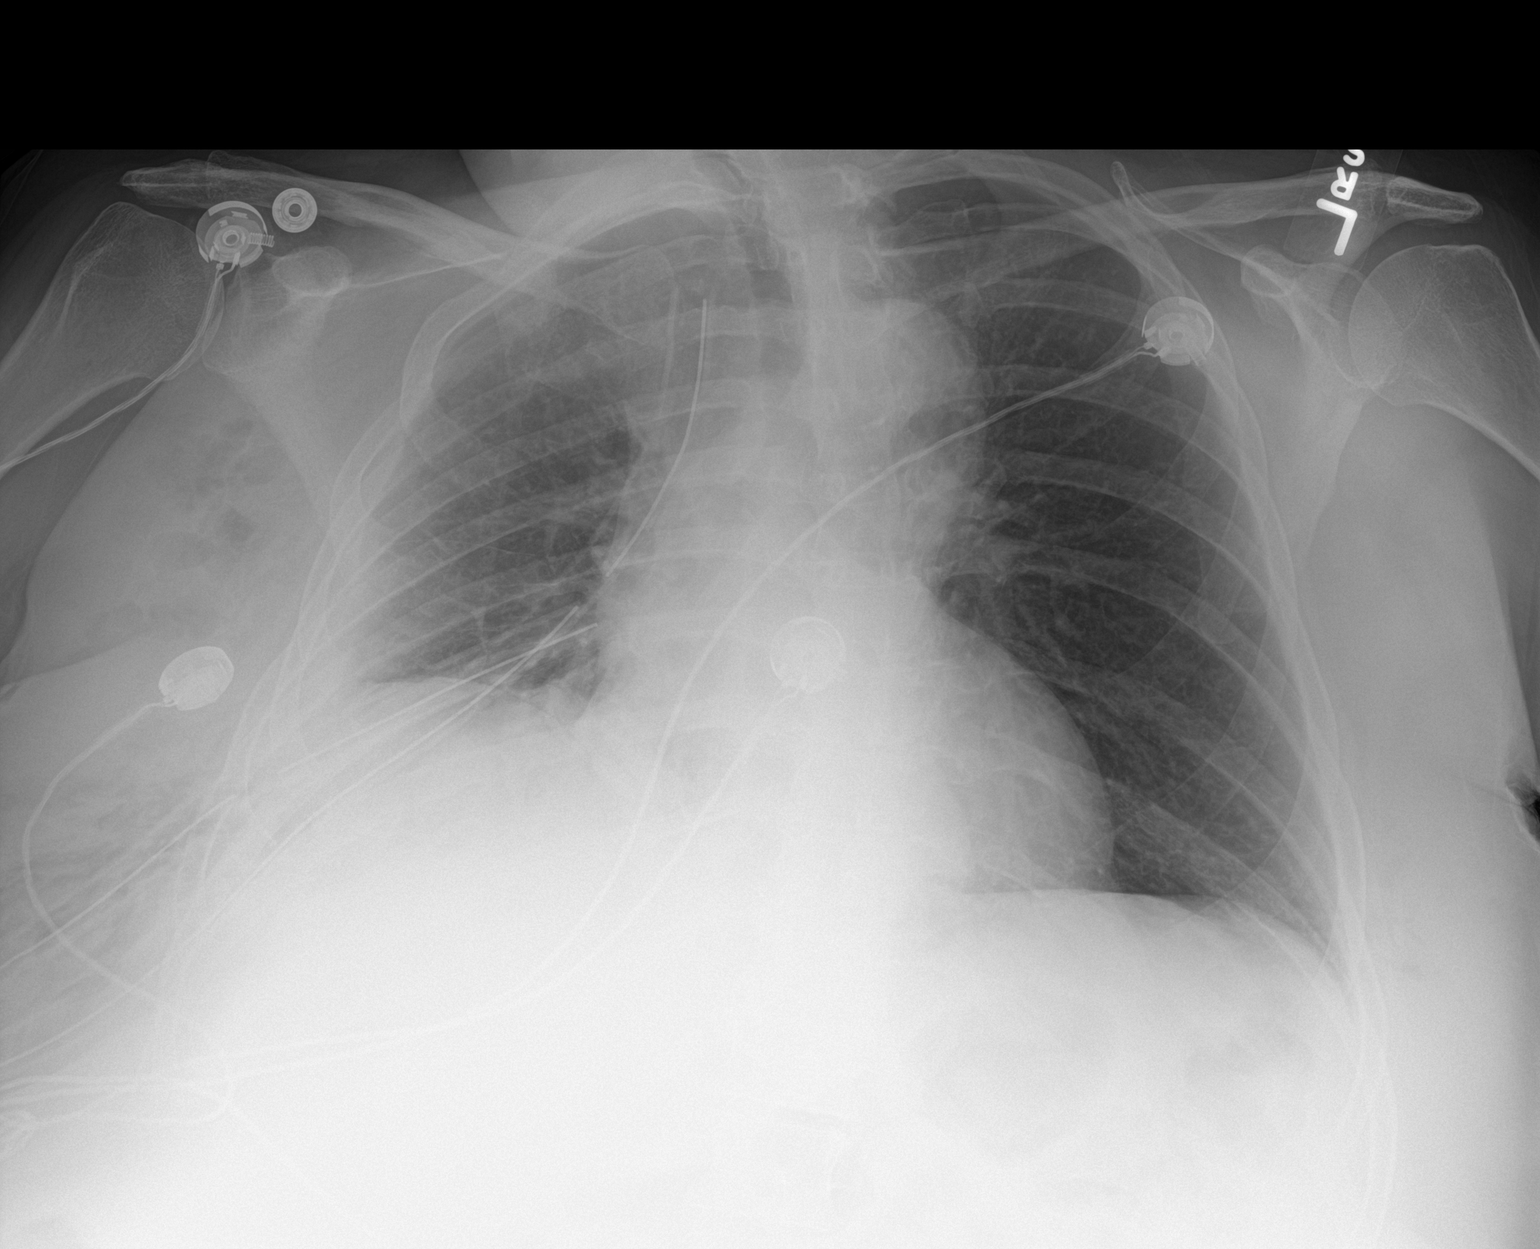

[1 of 1 positions shown; findings below may reference images not displayed]

FINDINGS: Two right-sided chest tubes are in place. 1 of the chest tubes side
hole is at the level of the border of the thorax and cannot be
confirmed as completely within the right thorax. Subcutaneous
emphysema. Pleural thickening. No obvious pneumothorax. Right base
subsegmental atelectasis and elevated right hemidiaphragm.

Cardiomegaly.  Calcified tortuous aorta.  Possible hiatal hernia.
IMPRESSION: 1. Two right-sided chest tubes are in place. 1 of the chest tubes
side hole is at the level of the border of the thorax and cannot be
confirmed as completely within the right thorax (slight patient
rotation to the right limits evaluation). Subcutaneous emphysema.
2. Pleural thickening.  No obvious pneumothorax.
3. Right base subsegmental atelectasis and elevated right
hemidiaphragm.
4.  Aortic Atherosclerosis ([id number]).
5. These results will be called to the ordering clinician or
representative by the Radiologist Assistant, and communication
documented in the PACS or zVision Dashboard.

## 2020-07-26 IMAGING — DX DG CHEST 1V PORT
1 series · 1 of 1 positions shown · non-contrast
Comparison: Radiograph July 16, 2018.

CLINICAL DATA: Chest tube removal.

EXAM:
PORTABLE CHEST 1 VIEW

[chest ap]
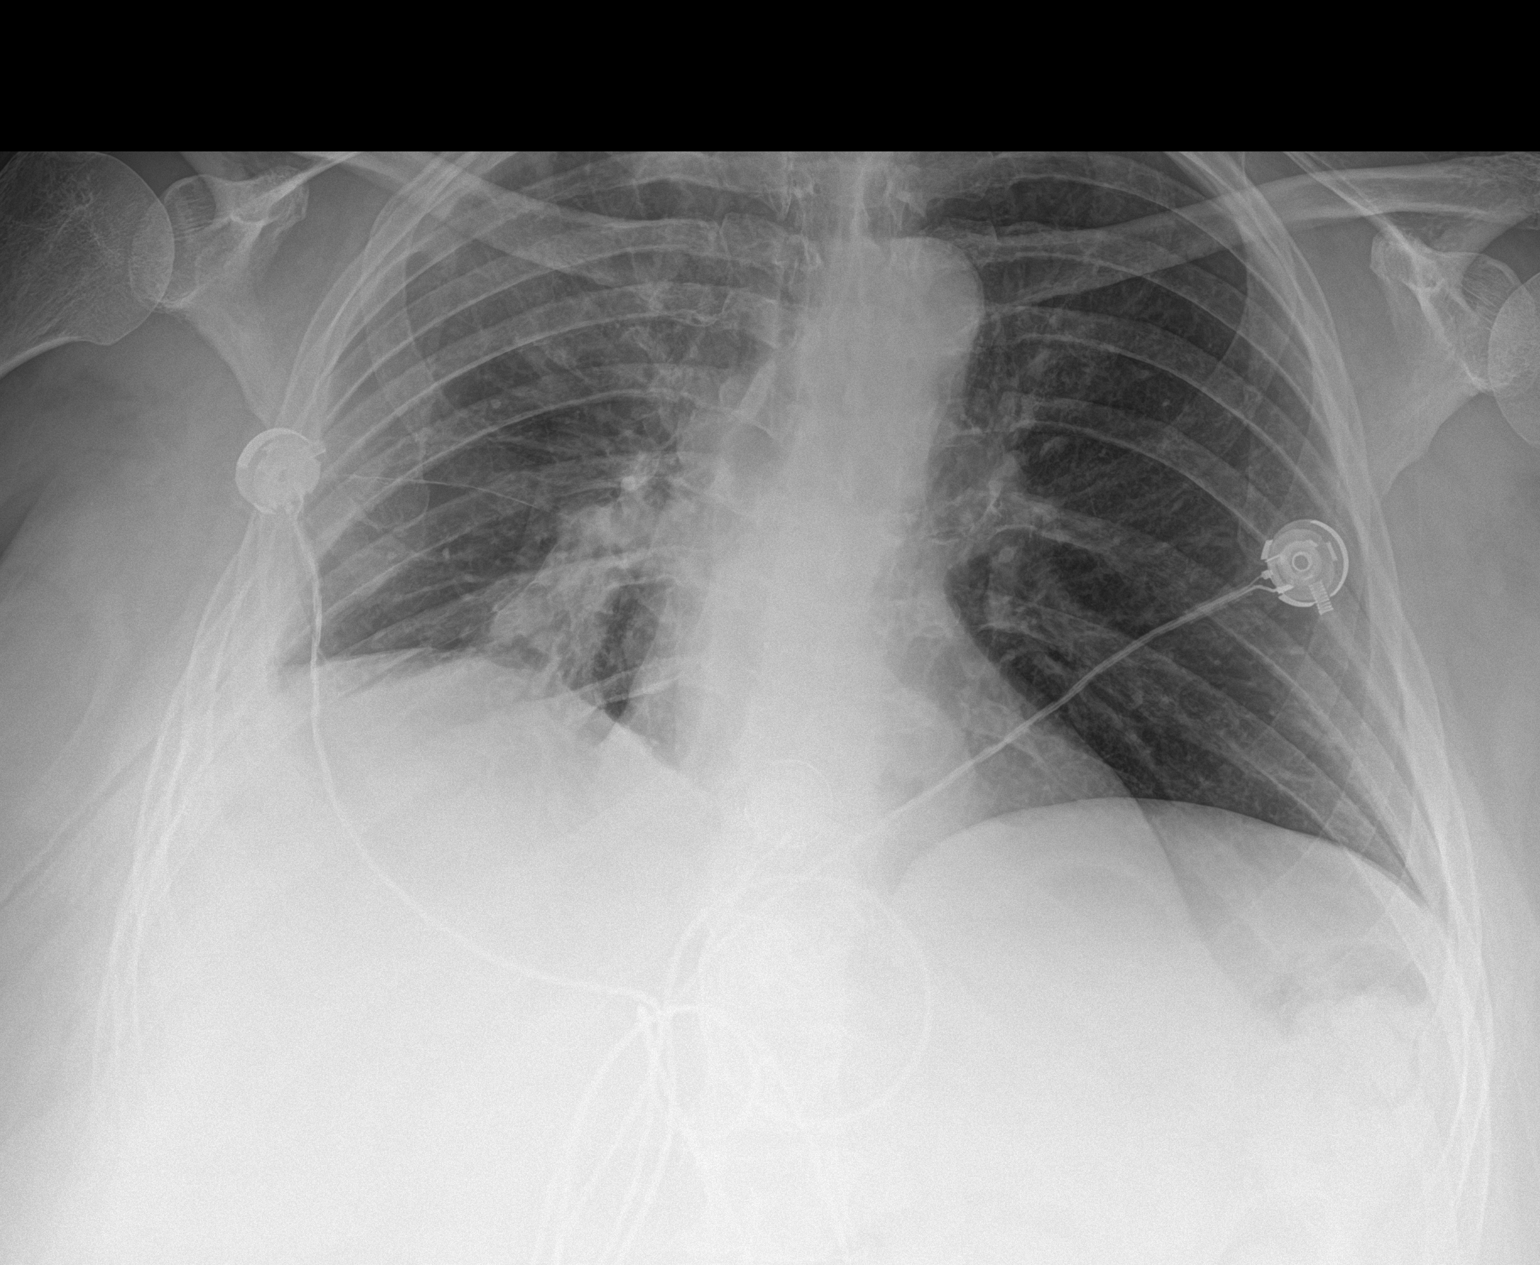

[1 of 1 positions shown; findings below may reference images not displayed]

FINDINGS: The heart size and mediastinal contours are within normal limits. No
pneumothorax is noted. Left lung is clear. Two right-sided chest
tubes have been removed. No definite pneumothorax is noted. Mild
right basilar subsegmental atelectasis is noted with minimal pleural
effusion. Small hiatal hernia is noted. The visualized skeletal
structures are unremarkable.
IMPRESSION: No definite pneumothorax status post right-sided chest tube removal.
Mild right basilar subsegmental atelectasis is noted with minimal
right pleural effusion.
